# Patient Record
Sex: Female | Born: 2000 | Race: Black or African American | Hispanic: No | Smoking: Never smoker
Health system: Southern US, Community
[De-identification: ages and names within clinical notes are randomized; demographics above are authoritative.]

## PROBLEM LIST (undated history)

## (undated) DIAGNOSIS — A749 Chlamydial infection, unspecified: Secondary | ICD-10-CM

## (undated) DIAGNOSIS — Z1159 Encounter for screening for other viral diseases: Secondary | ICD-10-CM

## (undated) DIAGNOSIS — N760 Acute vaginitis: Secondary | ICD-10-CM

## (undated) DIAGNOSIS — B9689 Other specified bacterial agents as the cause of diseases classified elsewhere: Secondary | ICD-10-CM

## (undated) DIAGNOSIS — B009 Herpesviral infection, unspecified: Secondary | ICD-10-CM

## (undated) HISTORY — DX: Herpesviral infection, unspecified: Z11.59

## (undated) HISTORY — DX: Other specified bacterial agents as the cause of diseases classified elsewhere: B96.89

## (undated) HISTORY — DX: Herpesviral infection, unspecified: B00.9

## (undated) HISTORY — PX: OTHER SURGICAL HISTORY: SHX169

## (undated) HISTORY — DX: Acute vaginitis: N76.0

## (undated) HISTORY — DX: Chlamydial infection, unspecified: A74.9

---
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
## 2021-01-27 DIAGNOSIS — Z419 Encounter for procedure for purposes other than remedying health state, unspecified: Secondary | ICD-10-CM | POA: Diagnosis not present

## 2021-02-05 ENCOUNTER — Ambulatory Visit (INDEPENDENT_AMBULATORY_CARE_PROVIDER_SITE_OTHER): Payer: Medicaid Other | Admitting: Certified Nurse Midwife

## 2021-02-05 ENCOUNTER — Other Ambulatory Visit (HOSPITAL_COMMUNITY)
Admission: RE | Admit: 2021-02-05 | Discharge: 2021-02-05 | Disposition: A | Discharge status: Home / Self Care | Payer: Medicaid Other | Source: Ambulatory Visit | Attending: Certified Nurse Midwife | Admitting: Certified Nurse Midwife

## 2021-02-05 ENCOUNTER — Other Ambulatory Visit: Payer: Self-pay

## 2021-02-05 ENCOUNTER — Encounter: Payer: Self-pay | Admitting: Certified Nurse Midwife

## 2021-02-05 VITALS — BP 102/69 | HR 73 | Ht 64.0 in | Wt 106.3 lb

## 2021-02-05 DIAGNOSIS — Z23 Encounter for immunization: Secondary | ICD-10-CM

## 2021-02-05 DIAGNOSIS — R399 Unspecified symptoms and signs involving the genitourinary system: Secondary | ICD-10-CM

## 2021-02-05 DIAGNOSIS — Z113 Encounter for screening for infections with a predominantly sexual mode of transmission: Secondary | ICD-10-CM | POA: Diagnosis not present

## 2021-02-05 DIAGNOSIS — R109 Unspecified abdominal pain: Secondary | ICD-10-CM | POA: Diagnosis not present

## 2021-02-05 DIAGNOSIS — Z01419 Encounter for gynecological examination (general) (routine) without abnormal findings: Secondary | ICD-10-CM | POA: Diagnosis not present

## 2021-02-05 LAB — POCT URINALYSIS DIPSTICK
Bilirubin, UA: NEGATIVE
Blood, UA: NEGATIVE
Glucose, UA: NEGATIVE
Ketones, UA: NEGATIVE
Leukocytes, UA: NEGATIVE
Nitrite, UA: NEGATIVE
Protein, UA: NEGATIVE
Spec Grav, UA: 1.025 (ref 1.010–1.025)
Urobilinogen, UA: 0.2 E.U./dL
pH, UA: 6.5 (ref 5.0–8.0)

## 2021-02-05 MED ORDER — TETANUS-DIPHTH-ACELL PERTUSSIS 5-2.5-18.5 LF-MCG/0.5 IM SUSY
0.5000 mL | PREFILLED_SYRINGE | Freq: Once | INTRAMUSCULAR | Status: AC
  Administered 2021-02-05: 0.5 mL via INTRAMUSCULAR

## 2021-02-05 NOTE — Progress Notes (Signed)
GYNECOLOGY ANNUAL PREVENTATIVE CARE ENCOUNTER NOTE  History:     Kaitlin Ford is a 21 y.o. G0P0000 female here for a routine annual gynecologic exam.  Current complaints: abdominal pain , feels like someone punched her in her stomach, and nausea for the past few weeks.   Denies abnormal vaginal bleeding, discharge, pelvic pain, problems with intercourse or other gynecologic concerns.     Social Relationship: boyfriend Living: apartment alone /sometimes stays with boyfriend Work: in Multimedia programmer  Exercise: 10 min 3 x wk Smoke/Alcohol/drug use: alcohol use -2 x week, MJ use  2x wk, Vaping 3-5 times a day.  Gynecologic History No LMP recorded (lmp unknown). Contraception: condoms, Nexplanon ( expired ) Last Pap: n/a .  Last mammogram: n/a.    Upstream - 02/05/21 1101       Pregnancy Intention Screening   Does the patient want to become pregnant in the next year? No    Does the patient's partner want to become pregnant in the next year? Yes    Would the patient like to discuss contraceptive options today? Yes      Contraception Wrap Up   Current Method No Method - Other Reason            The pregnancy intention screening data noted above was reviewed. Potential methods of contraception were discussed. The patient elected to proceed with condoms.   Obstetric History OB History  Gravida Para Term Preterm AB Living  0 0 0 0 0 0  SAB IAB Ectopic Multiple Live Births  0 0 0 0 0    History reviewed. No pertinent past medical history.  History reviewed. No pertinent surgical history.  Current Outpatient Medications on File Prior to Visit  Medication Sig Dispense Refill   ondansetron (ZOFRAN) 4 MG tablet Take 1 tablet (4 mg total) by mouth every 6 (six) hours. (Patient not taking: Reported on 02/05/2021) 12 tablet 0   No current facility-administered medications on file prior to visit.    No Known Allergies  Social History:  reports that she has  never smoked. She has never used smokeless tobacco. She reports current alcohol use. She reports that she does not use drugs.  Family History  Problem Relation Age of Onset   Breast cancer Mother    Seizures Mother    Cancer Maternal Grandmother     The following portions of the patient's history were reviewed and updated as appropriate: allergies, current medications, past family history, past medical history, past social history, past surgical history and problem list.  Review of Systems Pertinent items noted in HPI and remainder of comprehensive ROS otherwise negative.  Physical Exam:  BP 102/69    Pulse 73    Ht 5\' 4"  (1.626 m)    Wt 106 lb 4.8 oz (48.2 kg)    LMP  (LMP Unknown)    BMI 18.25 kg/m  CONSTITUTIONAL: Well-developed, well-nourished female in no acute distress.  HENT:  Normocephalic, atraumatic, External right and left ear normal. Oropharynx is clear and moist EYES: Conjunctivae and EOM are normal. Pupils are equal, round, and reactive to light. No scleral icterus.  NECK: Normal range of motion, supple, no masses.  Normal thyroid.  SKIN: Skin is warm and dry. No rash noted. Not diaphoretic. No erythema. No pallor. MUSCULOSKELETAL: Normal range of motion. No tenderness.  No cyanosis, clubbing, or edema.  2+ distal pulses. NEUROLOGIC: Alert and oriented to person, place, and time. Normal reflexes, muscle tone coordination.  PSYCHIATRIC:  Normal mood and affect. Normal behavior. Normal judgment and thought content. CARDIOVASCULAR: Normal heart rate noted, regular rhythm RESPIRATORY: Clear to auscultation bilaterally. Effort and breath sounds normal, no problems with respiration noted. BREASTS: Symmetric in size. No masses, tenderness, skin changes, nipple drainage, or lymphadenopathy bilaterally.  ABDOMEN: Soft, no distention noted.  No tenderness, rebound or guarding.  PELVIC: Normal appearing external genitalia and urethral meatus; normal appearing vaginal mucosa and cervix.   No abnormal discharge noted.  Pap smear not due.Swab for STD screening.  Normal uterine size, no other palpable masses, no uterine or adnexal tenderness.  .   Assessment and Plan:    1. Well woman exam with routine gynecological exam  Pap: not indicated Mammogram : n/a  Labs: STD testing, vaginal swab Refills: Tdap today, Flu today Referral:GI  Routine preventative health maintenance measures emphasized. Please refer to After Visit Summary for other counseling recommendations.    Info given on HPV vaccine.   Doreene Burke, CNM [facility],  [facility]

## 2021-02-05 NOTE — Patient Instructions (Signed)
Preventing Cervical Cancer Cervical cancer is cancer that grows on the cervix. The cervix is at the bottom of the uterus. It connects the uterus to the vagina. The uterus is where a baby develops during pregnancy. Cancer occurs when cells become abnormal and start to grow out of control. If cervical cancer is not found early, it can spread and become dangerous. Cervical cancer cannot always be prevented, but you can take steps to lower your risk of developing this condition. How can this condition affect me? Cervical cancer grows slowly and may not cause any symptoms at first. Over time, the cancer can grow deep into the cervix tissue and spread to other areas. This may take years, and it may happen without you knowing about it. If it is found early, cervical cancer can be treated effectively. If the cancer has grown deep into your cervix or has spread, it will be more difficult to treat. Most cases of cervical cancer are caused by an STI (sexually transmitted infection) called human papillomavirus (HPV). One way to reduce your risk of cervical cancer is to take steps to avoid infection with the HPV virus. Getting regular Pap tests is also important because this can help identify changes in cells that could lead to cancer. Your chances of getting this disease can also be reduced by making certain lifestyle changes. What can increase my risk? You are more likely to develop this condition if: You have certain things in your sexual history, such as: Having a sexually transmitted viral infection. These include chlamydia and herpes. Having more than one sexual partner, or having sex with someone who has more than one sexual partner. Not using condoms during sex. Having been sexually active before the age of 15. Your mother took a medicine called diethylstilbestrol (DES) while pregnant with you, causing you to be exposed to this medicine before birth. Your mother or sister has had cervical cancer. You are  between the ages of 31-50. You have or have had certain other medical conditions, such as: Previous cancer of the vagina or vulva. A weakened body defense system (immune system). A history of dysplasia of the cervix. You use oral contraceptives, also called birth control pills. You smoke or breathe in secondhand smoke. What actions can I take to prevent cervical cancer? Preventing HPV infection  Ask your health care provider about getting the HPV vaccine. If you are 34 years old or younger, you may need to get this vaccine, which is given in three doses over 6 months. This vaccine protects against the types of HPV that could cause cancer. Limit the number of people you have sex with. Also avoid having sex with people who have had many sex partners. Use a latex condom every time you have sex. Getting Pap tests Get Pap tests regularly, starting at age 69. Talk with your health care provider about how often you need these tests. Having regular Pap tests will help identify changes in cells that could lead to cancer. Steps can then be taken to prevent cancer from developing. Most women who are 37?20 years of age should have a Pap test every 3 years. Most women who are 42?20 years of age should have a Pap test in combination with an HPV test every 5 years. Women with a higher risk of cervical cancer, such as those with a weakened immune system or those who were exposed to DES medicine before birth, may need more frequent testing. Making other lifestyle changes  Do not use any  products that contain nicotine or tobacco, such as cigarettes, e-cigarettes, and chewing tobacco. If you need help quitting, ask your health care provider. Eat a healthy diet that includes at least 5 servings of fruits and vegetables every day. Lose weight if you are overweight. Where to find support Talk with your health care provider, school nurse, or local health department for guidance about screening and  vaccination. Some children and teens may be able to get the HPV vaccine free of charge through the U.S. government's Vaccines for Children [facility]) program. Other places that provide vaccinations include: Public health clinics. Check with your local health department. [place], where you would pay only what you can afford. To find one near you, check this website: http://lyons.com/ [place]. These are part of a program for Medicare and Medicaid patients who live in rural areas. The National Breast and Cervical Cancer Early Detection Program also provides breast and cervical cancer screenings and diagnostic services to low-income, uninsured, and underinsured women. Cervical cancer can be passed down through families. Talk with your health care provider or a genetic counselor to learn more about genetic testing for cancer. Where to find more information Learn more about cervical cancer from: [employer] of Gynecology: www.acog.org American Cancer Society: www.cancer.org Centers for Disease Control and Prevention: http://www.wolf.info/ Contact a health care provider if you have: Pelvic pain. Unusual discharge or bleeding from your vagina. Summary Cervical cancer is cancer that grows on the cervix. The cervix is at the bottom of the uterus. Ask your health care provider about getting the HPV vaccine. Be sure to get regular Pap tests as recommended by your health care provider. See your health care provider right away if you have any pelvic pain or unusual discharge or bleeding from your vagina. This information is not intended to replace advice given to you by your health care provider. Make sure you discuss any questions you have with your health care provider. Document Revised: 08/16/2018 Document Reviewed: 08/16/2018 Elsevier Patient Education  [place] 50-10 Years Old, Female Preventive care refers to lifestyle choices and  visits with your health care provider that can promote health and wellness. Preventive care visits are also called wellness exams. What can I expect for my preventive care visit? Counseling During your preventive care visit, your health care provider may ask about your: Medical history, including: Past medical problems. Family medical history. Pregnancy history. Current health, including: Menstrual cycle. Method of birth control. Emotional well-being. Home life and relationship well-being. Sexual activity and sexual health. Lifestyle, including: Alcohol, nicotine or tobacco, and drug use. Access to firearms. Diet, exercise, and sleep habits. Work and work Statistician. Sunscreen use. Safety issues such as seatbelt and bike helmet use. Physical exam Your health care provider may check your: Height and weight. These may be used to calculate your BMI (body mass index). BMI is a measurement that tells if you are at a healthy weight. Waist circumference. This measures the distance around your waistline. This measurement also tells if you are at a healthy weight and may help predict your risk of certain diseases, such as type 2 diabetes and high blood pressure. Heart rate and blood pressure. Body temperature. Skin for abnormal spots. What immunizations do I need? Vaccines are usually given at various ages, according to a schedule. Your health care provider will recommend vaccines for you based on your age, medical history, and lifestyle or other factors, such as travel or where you work. What tests  do I need? Screening Your health care provider may recommend screening tests for certain conditions. This may include: Pelvic exam and Pap test. Lipid and cholesterol levels. Diabetes screening. This is done by checking your blood sugar (glucose) after you have not eaten for a while (fasting). Hepatitis B test. Hepatitis C test. HIV (human immunodeficiency virus) test. STI (sexually  transmitted infection) testing, if you are at risk. BRCA-related cancer screening. This may be done if you have a family history of breast, ovarian, tubal, or peritoneal cancers. Talk with your health care provider about your test results, treatment options, and if necessary, the need for more tests. Follow these instructions at home: Eating and drinking  Eat a healthy diet that includes fresh fruits and vegetables, whole grains, lean protein, and low-fat dairy products. Take vitamin and mineral supplements as recommended by your health care provider. Do not drink alcohol if: Your health care provider tells you not to drink. You are pregnant, may be pregnant, or are planning to become pregnant. If you drink alcohol: Limit how much you have to 0-1 drink a day. Know how much alcohol is in your drink. In the [place] one drink equals one 12 oz bottle of beer (355 mL), one 5 oz glass of wine (148 mL), or one 1 oz glass of hard liquor (44 mL). Lifestyle Brush your teeth every morning and night with fluoride toothpaste. Floss one time each day. Exercise for at least 30 minutes 5 or more days each week. Do not use any products that contain nicotine or tobacco. These products include cigarettes, chewing tobacco, and vaping devices, such as e-cigarettes. If you need help quitting, ask your health care provider. Do not use drugs. If you are sexually active, practice safe sex. Use a condom or other form of protection to prevent STIs. If you do not wish to become pregnant, use a form of birth control. If you plan to become pregnant, see your health care provider for a prepregnancy visit. Find healthy ways to manage stress, such as: Meditation, yoga, or listening to music. Journaling. Talking to a trusted person. Spending time with friends and family. Minimize exposure to UV radiation to reduce your risk of skin cancer. Safety Always wear your seat belt while driving or riding in a vehicle. Do not  drive: If you have been drinking alcohol. Do not ride with someone who has been drinking. If you have been using any mind-altering substances or drugs. While texting. When you are tired or distracted. Wear a helmet and other protective equipment during sports activities. If you have firearms in your house, make sure you follow all gun safety procedures. Seek help if you have been physically or sexually abused. What's next? Go to your health care provider once a year for an annual wellness visit. Ask your health care provider how often you should have your eyes and teeth checked. Stay up to date on all vaccines. This information is not intended to replace advice given to you by your health care provider. Make sure you discuss any questions you have with your health care provider. Document Revised: 07/11/2020 Document Reviewed: 07/11/2020 Elsevier Patient Education  [place].

## 2021-02-07 ENCOUNTER — Telehealth: Payer: Self-pay

## 2021-02-07 DIAGNOSIS — A599 Trichomoniasis, unspecified: Secondary | ICD-10-CM

## 2021-02-07 LAB — CERVICOVAGINAL ANCILLARY ONLY
Bacterial Vaginitis (gardnerella): NEGATIVE
Candida Glabrata: NEGATIVE
Candida Vaginitis: NEGATIVE
Chlamydia: NEGATIVE
Comment: NEGATIVE
Comment: NEGATIVE
Comment: NEGATIVE
Comment: NEGATIVE
Comment: NEGATIVE
Comment: NORMAL
Neisseria Gonorrhea: NEGATIVE
Trichomonas: POSITIVE — AB

## 2021-02-07 LAB — URINE CULTURE: Organism ID, Bacteria: NO GROWTH

## 2021-02-07 MED ORDER — METRONIDAZOLE 500 MG PO TABS
500.0000 mg | ORAL_TABLET | Freq: Two times a day (BID) | ORAL | 0 refills | Status: DC
Start: 2021-02-07 — End: 2021-04-08

## 2021-02-07 MED ORDER — METRONIDAZOLE 500 MG PO TABS: 500.0000 mg | ORAL_TABLET | Freq: Once | ORAL | 0 refills | Status: AC

## 2021-02-07 NOTE — Telephone Encounter (Signed)
Spoke with patient in regards to positive lab results. Explained to pt what she tested positive for; relayed to her to take prescribed abx for treatment and that partner must be tested/treated as well. Okayed by MS to send in treatment for partner. Advised pt to abstain from intercourse until her and partner are fully treated and to use condoms in future. Pt agrees to plan and agrees to f/u for TOC in 4 weeks. All questions answered.

## 2021-02-07 NOTE — Telephone Encounter (Signed)
Scheduled for 04/08/2021 at 215

## 2021-02-08 LAB — HEPATITIS C ANTIBODY: Hep C Virus Ab: <0.1 s/co ratio (ref 0.0–0.9)

## 2021-02-08 LAB — HSV(HERPES SIMPLEX VRS) I + II AB-IGG
HSV 1 Glycoprotein G Ab, IgG: 43.3 index — ABNORMAL HIGH (ref 0.00–0.90)
HSV 2 IgG, Type Spec: 4.93 index — ABNORMAL HIGH (ref 0.00–0.90)

## 2021-02-08 LAB — HSV-2 IGG SUPPLEMENTAL TEST: HSV-2 IgG Supplemental Test: POSITIVE — AB

## 2021-02-08 LAB — HIV ANTIBODY (ROUTINE TESTING W REFLEX): HIV Screen 4th Generation wRfx: NONREACTIVE

## 2021-02-08 LAB — HEPATITIS B SURFACE ANTIGEN: Hepatitis B Surface Ag: NEGATIVE

## 2021-02-08 LAB — RPR: RPR Ser Ql: NONREACTIVE

## 2021-02-10 ENCOUNTER — Other Ambulatory Visit: Payer: Self-pay | Admitting: Certified Nurse Midwife

## 2021-02-12 ENCOUNTER — Ambulatory Visit (INDEPENDENT_AMBULATORY_CARE_PROVIDER_SITE_OTHER): Payer: Medicaid Other | Admitting: Certified Nurse Midwife

## 2021-02-12 ENCOUNTER — Encounter: Payer: Self-pay | Admitting: Certified Nurse Midwife

## 2021-02-12 ENCOUNTER — Other Ambulatory Visit: Payer: Self-pay

## 2021-02-12 VITALS — BP 102/67 | HR 82 | Ht 64.0 in | Wt 105.4 lb

## 2021-02-12 DIAGNOSIS — Z3046 Encounter for surveillance of implantable subdermal contraceptive: Secondary | ICD-10-CM

## 2021-02-12 NOTE — Progress Notes (Deleted)
Established Patient Office Visit  Subjective:  Patient ID: Kaitlin Ford, female    DOB: 2000-09-20  Age: 21 y.o. 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  Family History  Problem Relation Age of Onset   Breast cancer Mother    Seizures Mother    Cancer Maternal Grandmother     Social History   Socioeconomic History   Marital status: Single    Spouse name: Not on file   Number of children: Not on file   Years of education: Not on file   Highest education level: Not on file  Occupational History   Not on file  Tobacco Use   Smoking status: Never   Smokeless tobacco: Never  Substance and Sexual Activity   Alcohol use: Yes   Drug use: No   Sexual activity: Yes    Birth control/protection: None  Other Topics Concern   Not on file  Social History Narrative   Not on file   Social Determinants of Health   Financial Resource Strain: Not on file  Food Insecurity: Not on file  Transportation Needs: Not on file  Physical Activity: Not on file  Stress: Not on file  Social Connections: Not on file  Intimate Partner Violence: Not on file    Outpatient Medications Prior to Visit  Medication Sig Dispense Refill   metroNIDAZOLE (FLAGYL) 500 MG tablet Take 1 tablet (500 mg total) by mouth 2 (two) times daily. 14 tablet 0   ondansetron (ZOFRAN) 4 MG tablet Take 1 tablet (4 mg total) by mouth every 6 (six) hours. (Patient not taking: Reported on 02/05/2021) 12 tablet 0   No facility-administered medications prior to visit.    No Known Allergies  ROS Review of Systems    Objective:    Physical Exam  BP 102/67    Pulse 82    Ht 5\' 4"  (1.626 m)    Wt 105 lb 6.4 oz (47.8 kg)    LMP 02/11/2021 (Exact Date)     BMI 18.09 kg/m  Wt Readings from Last 3 Encounters:  02/12/21 105 lb 6.4 oz (47.8 kg) (9 %, Z= -1.34)*  02/05/21 106 lb 4.8 oz (48.2 kg) (10 %, Z= -1.27)*  07/24/20 106 lb 9.6 oz (48.4 kg) (12 %, Z= -1.19)*   * Growth percentiles are based on CDC (Girls, 2-20 Years) data.     Health Maintenance Due  Topic Date Due   HPV VACCINES (1 - 2-dose series) Never done       Topic Date Due   HPV VACCINES (1 - 2-dose series) Never done    No results found for: TSH Lab Results  Component Value Date   WBC 7.2 09/10/2018   HGB 11.7 (L) 09/10/2018   HCT 35.8 (L) 09/10/2018   MCV 91.8 09/10/2018   PLT 303 09/10/2018   Lab Results  Component Value Date   NA 139 09/10/2018   K 3.5 09/10/2018   CO2 25 09/10/2018   GLUCOSE 93 09/10/2018   BUN 9 09/10/2018   CREATININE 0.61 09/10/2018   BILITOT 0.5 09/10/2018   ALKPHOS 63 09/10/2018   AST 13 (L) 09/10/2018   ALT 8 09/10/2018  PROT 7.9 09/10/2018   ALBUMIN 4.1 09/10/2018   CALCIUM 9.4 09/10/2018   ANIONGAP 9 09/10/2018   No results found for: CHOL No results found for: HDL No results found for: LDLCALC No results found for: TRIG No results found for: CHOLHDL No results found for: HGDJ2E    Assessment & Plan:   Problem List Items Addressed This Visit   None   No orders of the defined types were placed in this encounter.   Follow-up: Return in about 3 weeks (around 03/05/2021) for toc.    Doreene Burke, CNM

## 2021-02-12 NOTE — Progress Notes (Signed)
Kaitlin Ford is a 21 y.o. year old [place] female here for Nexplanon removal .  She was given informed consent for removal of her Nexplanon. Her Nexplanon was placed 3 yrs ago, Patient's last menstrual period was 02/11/2021 (exact date).,    BP 102/67    Pulse 82    Ht 5\' 4"  (1.626 m)    Wt 105 lb 6.4 oz (47.8 kg)    LMP 02/11/2021 (Exact Date)    BMI 18.09 kg/m  Patient's last menstrual period was 02/11/2021 (exact date). No results found for this or any previous visit (from the past 24 hour(s)).   Appropriate time out taken. Nexplanon site identified.  Area prepped in usual sterile fashon. Two cc's of 2% lidocaine was used to anesthetize the area. A small stab incision was made right beside the implant on the distal portion.  The Nexplanon rod was grasped using hemostats and removed intact without difficulty.    There was less than 3 cc blood loss. There were no complications.  The patient tolerated the procedure well.  She was instructed to keep the area clean and dry, remove pressure bandage in 24 hours, and keep insertion site covered with the steri-strips for 3-5 days.  She will use condoms for birth control.   Follow-up PRN problems.  Philip Aspen, CNM

## 2021-02-12 NOTE — Patient Instructions (Signed)
Nexplanon Instructions After care ° °Keep bandage clean and dry for 24 hours ° °May use ice/Tylenol/Ibuprofen for soreness or pain ° °If you develop fever, drainage or increased warmth from incision site-contact office immediately ° ° °

## 2021-02-26 ENCOUNTER — Ambulatory Visit (INDEPENDENT_AMBULATORY_CARE_PROVIDER_SITE_OTHER): Payer: Medicaid Other | Admitting: Certified Nurse Midwife

## 2021-02-26 ENCOUNTER — Other Ambulatory Visit: Payer: Self-pay

## 2021-02-26 ENCOUNTER — Encounter: Payer: Self-pay | Admitting: Certified Nurse Midwife

## 2021-02-26 ENCOUNTER — Other Ambulatory Visit (HOSPITAL_COMMUNITY)
Admission: RE | Admit: 2021-02-26 | Discharge: 2021-02-26 | Disposition: A | Discharge status: Home / Self Care | Payer: Medicaid Other | Source: Ambulatory Visit | Attending: Certified Nurse Midwife | Admitting: Certified Nurse Midwife

## 2021-02-26 VITALS — BP 107/70 | HR 84 | Ht 64.0 in | Wt 102.4 lb

## 2021-02-26 DIAGNOSIS — Z113 Encounter for screening for infections with a predominantly sexual mode of transmission: Secondary | ICD-10-CM

## 2021-02-26 NOTE — Progress Notes (Signed)
GYN ENCOUNTER NOTE  Subjective:       Kaitlin Ford is a 21 y.o. G0P0000 female is here for gynecologic evaluation of the following issues:  1. Follow for test of cure. Pt has positive swab for trichomoniasis 1/10.  States she did not sustain from intercourse as directed and is concerned that the infection may not have cleared.    Gynecologic History Patient's last menstrual period was 02/11/2021 (exact date). Contraception: condoms Last Pap: n/a.  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 Trichomoniasis   No past surgical history on file.  Current Outpatient Medications on File Prior to Visit  Medication Sig Dispense Refill   metroNIDAZOLE (FLAGYL) 500 MG tablet Take 1 tablet (500 mg total) by mouth 2 (two) times daily. 14 tablet 0   No current facility-administered medications on file prior to visit.    No Known Allergies  Social History   Socioeconomic History   Marital status: Single    Spouse name: Not on file   Number of children: Not on file   Years of education: Not on file   Highest education level: Not on file  Occupational History   Not on file  Tobacco Use   Smoking status: Never   Smokeless tobacco: Never  Substance and Sexual Activity   Alcohol use: Yes   Drug use: No   Sexual activity: Yes    Birth control/protection: None  Other Topics Concern   Not on file  Social History Narrative   Not on file   Social Determinants of Health   Financial Resource Strain: Not on file  Food Insecurity: Not on file  Transportation Needs: Not on file  Physical Activity: Not on file  Stress: Not on file  Social Connections: Not on file  Intimate Partner Violence: Not on file    Family History  Problem  Relation Age of Onset   Breast cancer Mother    Seizures Mother    Cancer Maternal Grandmother     The following portions of the patient's history were reviewed and updated as appropriate: allergies, current medications, past family history, past medical history, past social history, past surgical history and problem list.  Review of Systems Review of Systems - Negative except as mentioned in HPI Review of Systems - General ROS: negative for - chills, fatigue, fever, hot flashes, malaise or night sweats Hematological and Lymphatic ROS: negative for - bleeding problems or swollen lymph nodes Gastrointestinal ROS: negative for - abdominal pain, blood in stools, change in bowel habits and nausea/vomiting Musculoskeletal ROS: negative for - joint pain, muscle pain or muscular weakness Genito-Urinary ROS: negative for - change in menstrual cycle, dysmenorrhea, dyspareunia, dysuria, genital discharge, genital ulcers, hematuria, incontinence, irregular/heavy menses, nocturia or pelvic pain.  Objective:   LMP 02/11/2021 (Exact Date)  CONSTITUTIONAL: Well-developed, well-nourished female in no acute distress.  HENT:  Normocephalic, atraumatic.  NECK: Normal range of motion, supple, no masses.  Normal thyroid.  SKIN: Skin is warm and dry. No rash noted. Not diaphoretic. No erythema. No pallor. NEUROLGIC: Alert and oriented to person, place, and time. PSYCHIATRIC: Normal mood and affect. Normal behavior.  Normal judgment and thought content. CARDIOVASCULAR:Not Examined RESPIRATORY: Not Examined BREASTS: Not Examined ABDOMEN: Soft, non distended; Non tender.  No Organomegaly. PELVIC:  External Genitalia: Normal  BUS: Normal  Vagina: Normal  Cervix: Normal, white discharge no odor. MUSCULOSKELETAL: Normal range of motion. No tenderness.  No cyanosis, clubbing, or edema.     Assessment:   1. Screening for STD (sexually transmitted disease)   Plan:   Swab collected. Will follow up with  results. Encourage safe sex practices.   Philip Aspen, CNM

## 2021-02-27 DIAGNOSIS — Z419 Encounter for procedure for purposes other than remedying health state, unspecified: Secondary | ICD-10-CM | POA: Diagnosis not present

## 2021-02-28 ENCOUNTER — Other Ambulatory Visit: Payer: Self-pay | Admitting: Certified Nurse Midwife

## 2021-02-28 ENCOUNTER — Encounter: Payer: Self-pay | Admitting: Certified Nurse Midwife

## 2021-02-28 LAB — CERVICOVAGINAL ANCILLARY ONLY
Bacterial Vaginitis (gardnerella): NEGATIVE
Candida Glabrata: NEGATIVE
Candida Vaginitis: POSITIVE — AB
Chlamydia: NEGATIVE
Comment: NEGATIVE
Comment: NEGATIVE
Comment: NEGATIVE
Comment: NEGATIVE
Comment: NEGATIVE
Comment: NORMAL
Neisseria Gonorrhea: NEGATIVE
Trichomonas: NEGATIVE

## 2021-02-28 MED ORDER — FLUCONAZOLE 150 MG PO TABS: 150.0000 mg | ORAL_TABLET | Freq: Once | ORAL | 0 refills | Status: AC

## 2021-03-01 ENCOUNTER — Telehealth: Payer: Self-pay | Admitting: Certified Nurse Midwife

## 2021-03-01 ENCOUNTER — Other Ambulatory Visit: Payer: Self-pay

## 2021-03-01 DIAGNOSIS — B9689 Other specified bacterial agents as the cause of diseases classified elsewhere: Secondary | ICD-10-CM

## 2021-03-01 MED ORDER — FLUCONAZOLE 150 MG PO TABS: 150.0000 mg | ORAL_TABLET | Freq: Once | ORAL | 0 refills | Status: AC

## 2021-03-01 NOTE — Telephone Encounter (Signed)
Medication has been sent to pharmacy on file.  

## 2021-03-01 NOTE — Telephone Encounter (Signed)
Pt called stating that she was suppose to have RX sent to her pharmacy today and that there is nothing there- confirmed pharmacy on file.

## 2021-03-27 DIAGNOSIS — Z419 Encounter for procedure for purposes other than remedying health state, unspecified: Secondary | ICD-10-CM | POA: Diagnosis not present

## 2021-03-29 ENCOUNTER — Encounter: Payer: Self-pay | Admitting: Gastroenterology

## 2021-04-08 ENCOUNTER — Encounter: Payer: Self-pay | Admitting: Gastroenterology

## 2021-04-08 ENCOUNTER — Other Ambulatory Visit: Payer: Self-pay

## 2021-04-08 ENCOUNTER — Ambulatory Visit (INDEPENDENT_AMBULATORY_CARE_PROVIDER_SITE_OTHER): Payer: Medicaid Other | Admitting: Gastroenterology

## 2021-04-08 VITALS — BP 107/61 | HR 106 | Temp 98.7°F | Ht 64.0 in | Wt 103.5 lb

## 2021-04-08 DIAGNOSIS — R1010 Upper abdominal pain, unspecified: Secondary | ICD-10-CM | POA: Diagnosis not present

## 2021-04-08 MED ORDER — DICYCLOMINE HCL 10 MG PO CAPS: 10.0000 mg | ORAL_CAPSULE | Freq: Three times a day (TID) | ORAL | 0 refills | Status: DC

## 2021-04-08 MED ORDER — AMITRIPTYLINE HCL 10 MG PO TABS: 10.0000 mg | ORAL_TABLET | Freq: Every day | ORAL | 0 refills | Status: DC

## 2021-04-08 NOTE — Patient Instructions (Addendum)
Please come back for a lab test. Please do not eat or candy for a 1 hour before coming for labs. our lab is open on Mondays 1pm to 4pm, Tuesdays 8:30am to 4pm. Wednesdays 8:30am to 4pm and thursdays 1pm to 4pm.  ?

## 2021-04-08 NOTE — Progress Notes (Signed)
Arlyss Repress, MD [address]  [address]  [place], [place] [address]  Main: [phone]  Fax: [phone]    Gastroenterology Consultation  Referring Provider:     Vivi Martens, FNP Primary Care Physician:  Vivi Martens, FNP Primary Gastroenterologist:  Dr. Arlyss Repress Reason for Consultation:     Upper abdominal pain        HPI:   Kaitlin Ford is a 21 y.o. female referred by Dr. Vivi Martens, FNP  for consultation & management of upper abdominal pain.  Patient reports that for almost a year, she has been experiencing intermittent episodes of upper abdominal pain associated with nausea and irregular bowel habits.  She smokes marijuana.  Her appetite is low and GI symptoms are limiting her food intake.  Patient is accompanied by her boyfriend today.  Patient has not tried any medication so far.  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 No pertinent surgical history.   Current Outpatient Medications:    amitriptyline (ELAVIL) 10 MG tablet, Take 1 tablet (10 mg total) by mouth at bedtime., Disp: 30 tablet, Rfl: 0   dicyclomine (BENTYL) 10 MG capsule, Take 1 capsule (10 mg total) by mouth 3 (three) times daily before meals., Disp: 30 capsule, Rfl: 0   Family History  Problem Relation Age of Onset   Breast cancer Mother    Seizures Mother    Cancer Maternal Grandmother      Social History   Tobacco Use   Smoking status: Never   Smokeless tobacco: Never  Substance Use Topics   Alcohol use: Yes   Drug use: No    Allergies as of 04/08/2021   (No Known Allergies)    Review of Systems:    All systems reviewed and negative except where  noted in HPI.   Physical Exam:  BP 107/61 (BP Location: Left Arm, Patient Position: Sitting, Cuff Size: Normal)   Pulse (!) 106   Temp 98.7 F (37.1 C) (Oral)   Ht 5\' 4"  (1.626 m)   Wt 103 lb 8 oz (46.9 kg)   BMI 17.77 kg/m  No LMP recorded.  General:   Alert, thin built, moderately nourished, pleasant and cooperative in NAD Head:  Normocephalic and atraumatic. Eyes:  Sclera clear, no icterus.   Conjunctiva pink. Ears:  Normal auditory acuity. Nose:  No deformity, discharge, or lesions. Mouth:  No deformity or lesions,oropharynx pink & moist. Neck:  Supple; no masses or thyromegaly. Lungs:  Respirations even and unlabored.  Clear throughout to auscultation.   No wheezes, crackles, or rhonchi. No acute distress. Heart:  Regular rate and rhythm; no murmurs, clicks, rubs, or gallops. Abdomen:  Normal bowel sounds. Soft, non-tender and non-distended without masses, hepatosplenomegaly or hernias noted.  No guarding or rebound tenderness.   Rectal: Not performed Msk:  Symmetrical without gross deformities. Good, equal movement & strength bilaterally. Pulses:  Normal pulses noted. Extremities:  No clubbing or edema.  No cyanosis. Neurologic:  Alert and oriented x3;  grossly normal neurologically. Skin:  Intact without significant lesions or rashes. No jaundice. Psych:  Alert and cooperative. Normal mood and affect.  Imaging  Studies: CT abdomen pelvis with contrast from 08/2018 revealed cholelithiasis without evidence of cholecystitis  Assessment and Plan:   [name] is a 21 y.o. female with BMI 17.7 is seen in consultation for chronic symptoms of upper abdominal pain, associated with nausea, irregular bowel habits  Recommend H. pylori breath test Trial of dicyclomine 10mg  TID as needed Trial of low dose amitriptylline 10mg  at bedtime Patient called her mom over phone during the visit and her mom was concerned about unable to gain weight and she was asking if I could  prescribe steroids or some other medication to help gain weight.  I recommended against using steroids due to side effects.  If H. pylori breath test is negative and patient is not responding to above medication, will pursue further work-up including endoscopic evaluation, imaging and patient is agreeable with the plan Also, discussed with her about high-fiber diet, stool softener such as MiraLAX daily to keep her bowels regular, adequate intake of water   Follow up in 3 months   Arlyss Repress, MD

## 2021-04-09 ENCOUNTER — Encounter: Payer: Self-pay | Admitting: Gastroenterology

## 2021-04-16 DIAGNOSIS — R1010 Upper abdominal pain, unspecified: Secondary | ICD-10-CM | POA: Diagnosis not present

## 2021-04-18 LAB — H. PYLORI BREATH TEST: H pylori Breath Test: NEGATIVE

## 2021-04-22 ENCOUNTER — Other Ambulatory Visit: Payer: Self-pay

## 2021-04-22 ENCOUNTER — Encounter: Payer: Self-pay | Admitting: Gastroenterology

## 2021-04-22 DIAGNOSIS — R1013 Epigastric pain: Secondary | ICD-10-CM

## 2021-04-22 MED ORDER — METOCLOPRAMIDE HCL 5 MG PO TABS
5.0000 mg | ORAL_TABLET | Freq: Three times a day (TID) | ORAL | 0 refills | Status: DC
Start: 2021-04-22 — End: 2021-08-13

## 2021-04-22 NOTE — Telephone Encounter (Signed)
Schedule patient for a EGD on 04/25/21 went over instructions ans sent mychart message with instructions. Sent medication to the pharmacy  ?

## 2021-04-25 ENCOUNTER — Other Ambulatory Visit: Payer: Self-pay

## 2021-04-25 ENCOUNTER — Encounter: Payer: Self-pay | Admitting: Gastroenterology

## 2021-04-25 ENCOUNTER — Encounter
Admission: RE | Disposition: A | Discharge status: Home / Self Care | Payer: Self-pay | Source: Home / Self Care | Attending: Gastroenterology

## 2021-04-25 ENCOUNTER — Ambulatory Visit: Payer: Medicaid Other | Admitting: Registered Nurse

## 2021-04-25 ENCOUNTER — Ambulatory Visit
Admission: RE | Admit: 2021-04-25 | Discharge: 2021-04-25 | Disposition: A | Discharge status: Home / Self Care | Payer: Medicaid Other | Attending: Gastroenterology | Admitting: Gastroenterology

## 2021-04-25 DIAGNOSIS — R1013 Epigastric pain: Secondary | ICD-10-CM | POA: Insufficient documentation

## 2021-04-25 DIAGNOSIS — R110 Nausea: Secondary | ICD-10-CM | POA: Diagnosis not present

## 2021-04-25 HISTORY — PX: ESOPHAGOGASTRODUODENOSCOPY (EGD) WITH PROPOFOL: SHX5813

## 2021-04-25 LAB — POCT PREGNANCY, URINE: Preg Test, Ur: NEGATIVE

## 2021-04-25 SURGERY — ESOPHAGOGASTRODUODENOSCOPY (EGD) WITH PROPOFOL
Anesthesia: General

## 2021-04-25 MED ORDER — LIDOCAINE HCL (CARDIAC) PF 100 MG/5ML IV SOSY
PREFILLED_SYRINGE | INTRAVENOUS | Status: DC | PRN
  Administered 2021-04-25: 100 mg via INTRAVENOUS

## 2021-04-25 MED ORDER — PROPOFOL 500 MG/50ML IV EMUL
INTRAVENOUS | Status: AC
  Filled 2021-04-25: qty 100

## 2021-04-25 MED ORDER — PROPOFOL 10 MG/ML IV BOLUS
INTRAVENOUS | Status: DC | PRN
  Administered 2021-04-25: 30 mg via INTRAVENOUS
  Administered 2021-04-25: 70 mg via INTRAVENOUS

## 2021-04-25 MED ORDER — LIDOCAINE HCL (PF) 2 % IJ SOLN
INTRAMUSCULAR | Status: AC
  Filled 2021-04-25: qty 5

## 2021-04-25 MED ORDER — PROPOFOL 500 MG/50ML IV EMUL
INTRAVENOUS | Status: DC | PRN
  Administered 2021-04-25: 200 ug/kg/min via INTRAVENOUS

## 2021-04-25 MED ORDER — KETAMINE HCL 50 MG/ML IJ SOLN
INTRAMUSCULAR | Status: DC | PRN
  Administered 2021-04-25: 25 mg via INTRAMUSCULAR

## 2021-04-25 MED ORDER — DEXMEDETOMIDINE HCL 200 MCG/2ML IV SOLN
INTRAVENOUS | Status: DC | PRN
Start: 2021-04-25 — End: 2021-04-25
  Administered 2021-04-25: 20 ug via INTRAVENOUS

## 2021-04-25 MED ORDER — SODIUM CHLORIDE 0.9 % IV SOLN: INTRAVENOUS | Status: DC

## 2021-04-25 NOTE — Anesthesia Postprocedure Evaluation (Signed)
Anesthesia Post Note ? ?Patient: Maye Hyppolite ? ?Procedure(s) Performed: ESOPHAGOGASTRODUODENOSCOPY (EGD) WITH PROPOFOL ? ?Patient location during evaluation: PACU ?Anesthesia Type: General ?Level of consciousness: awake and oriented ?Pain management: satisfactory to patient ?Vital Signs Assessment: post-procedure vital signs reviewed and stable ?Respiratory status: spontaneous breathing and respiratory function stable ?Cardiovascular status: stable ?Anesthetic complications: no ? ? ?No notable events documented. ? ? ?Last Vitals:  ?Vitals:  ? 04/25/21 0954 04/25/21 1004  ?BP: (!) 95/56 100/62  ?Pulse: 79 71  ?Resp: 16 16  ?Temp:    ?SpO2: 100% 100%  ?  ?Last Pain:  ?Vitals:  ? 04/25/21 1004  ?TempSrc:   ?PainSc: 0-No pain  ? ? ?  ?  ?  ?  ?  ?  ? ?VAN STAVEREN,Katherleen Folkes ? ? ? ? ?

## 2021-04-25 NOTE — Anesthesia Preprocedure Evaluation (Signed)
Anesthesia Evaluation  ?Patient identified by MRN, date of birth, ID band ?Patient awake ? ? ? ?Reviewed: ?Allergy & Precautions, NPO status , Patient's Chart, lab work & pertinent test results ? ?Airway ?Mallampati: I ? ?TM Distance: >3 FB ?Neck ROM: Full ? ? ? Dental ? ?(+) Teeth Intact ?  ?Pulmonary ?neg pulmonary ROS,  ?  ?Pulmonary exam normal ?breath sounds clear to auscultation ? ? ? ? ? ? Cardiovascular ?negative cardio ROS ?Normal cardiovascular exam ?Rhythm:Regular Rate:Normal ? ? ?  ?Neuro/Psych ?negative neurological ROS ? negative psych ROS  ? GI/Hepatic ?negative GI ROS, Neg liver ROS,   ?Endo/Other  ?negative endocrine ROS ? Renal/GU ?negative Renal ROS  ?negative genitourinary ?  ?Musculoskeletal ?negative musculoskeletal ROS ?(+)  ? Abdominal ?Normal abdominal exam  (+)   ?Peds ?negative pediatric ROS ?(+)  Hematology ?negative hematology ROS ?(+)   ?Anesthesia Other Findings ?Past Medical History: ?No date: Bacterial vaginitis ?No date: Chlamydia ?No date: PCR DNA positive for HSV1 ?No date: PCR DNA positive for HSV2 ? ?No past surgical history on file. ? ? ? ? Reproductive/Obstetrics ?negative OB ROS ? ?  ? ? ? ? ? ? ? ? ? ? ? ? ? ?  ?  ? ? ? ? ? ? ? ? ?Anesthesia Physical ?Anesthesia Plan ? ?ASA: 2 ? ?Anesthesia Plan: General  ? ?Post-op Pain Management:   ? ?Induction: Intravenous ? ?PONV Risk Score and Plan: Propofol infusion and TIVA ? ?Airway Management Planned: Natural Airway and Nasal Cannula ? ?Additional Equipment:  ? ?Intra-op Plan:  ? ?Post-operative Plan:  ? ?Informed Consent: I have reviewed the patients History and Physical, chart, labs and discussed the procedure including the risks, benefits and alternatives for the proposed anesthesia with the patient or authorized representative who has indicated his/her understanding and acceptance.  ? ? ? ?Dental Advisory Given ? ?Plan Discussed with: Anesthesiologist, CRNA and Surgeon ? ?Anesthesia Plan  Comments:   ? ? ? ? ? ? ?Anesthesia Quick Evaluation ? ?

## 2021-04-25 NOTE — H&P (Signed)
?Kaitlin Repress, MD ?[address]  ?[address]  ?[place], [place] [address]  ?Main: [phone]  ?Fax: [phone] ?Pager: [phone] ? ?Primary Care Physician:  Vivi Martens, FNP ?Primary Gastroenterologist:  Dr. Arlyss Ford ? ?Pre-Procedure History & Physical: ?HPI:  Kaitlin Ford is a 21 y.o. female is here for an endoscopy. ?  ?Past Medical History:  ?Diagnosis Date  ? Bacterial vaginitis   ? Chlamydia   ? PCR DNA positive for HSV1   ? PCR DNA positive for HSV2   ? ? ?Past Surgical History:  ?Procedure Laterality Date  ? cyst left wrist removal    ? 2022  ? ? ?Prior to Admission medications   ?Medication Sig Start Date End Date Taking? Authorizing Provider  ?amitriptyline (ELAVIL) 10 MG tablet Take 1 tablet (10 mg total) by mouth at bedtime. 04/08/21  Yes Ludell Zacarias, Loel Dubonnet, MD  ?dicyclomine (BENTYL) 10 MG capsule Take 1 capsule (10 mg total) by mouth 3 (three) times daily before meals. 04/08/21   Toney Reil, MD  ?metoCLOPramide (REGLAN) 5 MG tablet Take 1 tablet (5 mg total) by mouth 4 (four) times daily -  before meals and at bedtime for 7 days. 04/22/21 04/29/21  Toney Reil, MD  ? ? ?Allergies as of 04/22/2021  ? (No Known Allergies)  ? ? ?Family History  ?Problem Relation Age of Onset  ? Breast cancer Mother   ? Seizures Mother   ? Cancer Maternal Grandmother   ? ? ?Social History  ? ?Socioeconomic History  ? Marital status: Single  ?  Spouse name: Not on file  ? Number of children: Not on file  ? Years of education: Not on file  ? Highest education level: Not on file  ?Occupational History  ? Not on file  ?Tobacco Use  ? Smoking status: Never  ? Smokeless tobacco: Never  ?Vaping Use  ? Vaping Use: Some days  ?Substance and Sexual Activity  ? Alcohol use: Yes  ?  Comment: occasionally  ? Drug use: Yes  ?  Types: Marijuana  ? Sexual activity: Yes  ?  Birth control/protection: None  ?Other Topics Concern  ? Not on file  ?Social History Narrative  ? Not on file  ? ?Social  Determinants of Health  ? ?Financial Resource Strain: Not on file  ?Food Insecurity: Not on file  ?Transportation Needs: Not on file  ?Physical Activity: Not on file  ?Stress: Not on file  ?Social Connections: Not on file  ?Intimate Partner Violence: Not on file  ? ? ?Review of Systems: ?See HPI, otherwise negative ROS ? ?Physical Exam: ?BP 108/68   Pulse 95   Temp (!) 96.7 ?F (35.9 ?C) (Temporal)   Resp 14   Ht 5\' 4"  (1.626 m)   Wt 47.2 kg   SpO2 100%   BMI 17.85 kg/m?  ?General:   Alert,  pleasant and cooperative in NAD ?Head:  Normocephalic and atraumatic. ?Neck:  Supple; no masses or thyromegaly. ?Lungs:  Clear throughout to auscultation.    ?Heart:  Regular rate and rhythm. ?Abdomen:  Soft, nontender and nondistended. Normal bowel sounds, without guarding, and without rebound.   ?Neurologic:  Alert and  oriented x4;  grossly normal neurologically. ? ?Impression/Plan: ?[name] is here for an endoscopy to be performed for upper abdominal pain, associated with nausea, irregular bowel habits ? ?Risks, benefits, limitations, and alternatives regarding  endoscopy have been reviewed with the patient.  Questions have been answered.  All parties agreeable. ? ? ?  Lannette Donath, MD  04/25/2021, 9:31 AM ?

## 2021-04-25 NOTE — Op Note (Signed)
[facility] ?Gastroenterology ?Patient Name: Kaitlin Ford ?Procedure Date: 04/25/2021 9:38 AM ?MRN: [id number] ?Account #: [id number] ?Date of Birth: 09/05/2000 ?Admit Type: Outpatient ?Age: 21 ?Room: [facility] ENDO ROOM 4 ?Gender: Female ?Note Status: Finalized ?Instrument Name: Upper Endoscope [id number] ?Procedure:             Upper GI endoscopy ?Indications:           Epigastric abdominal pain, Dyspepsia ?Providers:             Toney Reil MD, MD ?Referring MD:          Kandis Mannan. Margo Aye, MD (Referring MD) ?Medicines:             General Anesthesia ?Complications:         No immediate complications. Estimated blood loss: None. ?Procedure:             Pre-Anesthesia Assessment: ?                       - Prior to the procedure, a History and Physical was  ?                       performed, and patient medications and allergies were  ?                       reviewed. The patient is competent. The risks and  ?                       benefits of the procedure and the sedation options and  ?                       risks were discussed with the patient. All questions  ?                       were answered and informed consent was obtained.  ?                       Patient identification and proposed procedure were  ?                       verified by the physician, the nurse, the  ?                       anesthesiologist, the anesthetist and the technician  ?                       in the pre-procedure area in the procedure room in the  ?                       endoscopy suite. Mental Status Examination: alert and  ?                       oriented. Airway Examination: normal oropharyngeal  ?                       airway and neck mobility. Respiratory Examination:  ?                       clear to auscultation. CV Examination: normal.  ?  Prophylactic Antibiotics: The patient does not require  ?                       prophylactic antibiotics. Prior Anticoagulants: The  ?                        patient has taken no previous anticoagulant or  ?                       antiplatelet agents. ASA Grade Assessment: II - A  ?                       patient with mild systemic disease. After reviewing  ?                       the risks and benefits, the patient was deemed in  ?                       satisfactory condition to undergo the procedure. The  ?                       anesthesia plan was to use general anesthesia.  ?                       Immediately prior to administration of medications,  ?                       the patient was re-assessed for adequacy to receive  ?                       sedatives. The heart rate, respiratory rate, oxygen  ?                       saturations, blood pressure, adequacy of pulmonary  ?                       ventilation, and response to care were monitored  ?                       throughout the procedure. The physical status of the  ?                       patient was re-assessed after the procedure. ?                       After obtaining informed consent, the endoscope was  ?                       passed under direct vision. Throughout the procedure,  ?                       the patient's blood pressure, pulse, and oxygen  ?                       saturations were monitored continuously. The Endoscope  ?                       was introduced through the mouth, and advanced to the  ?  second part of duodenum. The upper GI endoscopy was  ?                       accomplished without difficulty. The patient tolerated  ?                       the procedure well. ?Findings: ?     The esophagus, gastroesophageal junction and examined esophagus were  ?     normal. ?     The stomach was normal. Biopsies were taken with a cold forceps for  ?     histology. ?     The examined duodenum was normal. ?     The cardia and gastric fundus were normal on retroflexion. ?     Esophagogastric landmarks were identified: the gastroesophageal junction  ?     was found at  35 cm from the incisors. ?Impression:            - Normal esophagus and gastroesophageal junction. ?                       - Normal stomach. Biopsied. ?                       - Normal examined duodenum. ?                       - Esophagogastric landmarks identified. ?Recommendation:        - Await pathology results. ?                       - Resume regular diet today. ?                       - Continue present medications. ?                       - Return to my office as previously scheduled. ?Procedure Code(s):     --- Professional --- ?                       [phone], Esophagogastroduodenoscopy, flexible,  ?                       transoral; with biopsy, single or multiple ?Diagnosis Code(s):     --- Professional --- ?                       R10.13, Epigastric pain ?CPT copyright 2019 American Medical Association. All rights reserved. ?The codes documented in this report are preliminary and upon coder review may  ?be revised to meet current compliance requirements. ?Dr. Libby Maw ?Marvetta Vohs Alger Memos MD, MD ?04/25/2021 9:52:38 AM ?This report has been signed electronically. ?Number of Addenda: 0 ?Note Initiated On: 04/25/2021 9:38 AM ?Estimated Blood Loss:  Estimated blood loss: none. ?     [facility] ?

## 2021-04-25 NOTE — Transfer of Care (Signed)
Immediate Anesthesia Transfer of Care Note ? ?Patient: Kaitlin Ford ? ?Procedure(s) Performed: ESOPHAGOGASTRODUODENOSCOPY (EGD) WITH PROPOFOL ? ?Patient Location: Endoscopy Unit ? ?Anesthesia Type:General ? ?Level of Consciousness: drowsy ? ?Airway & Oxygen Therapy: Patient Spontanous Breathing ? ?Post-op Assessment: Report given to RN and Post -op Vital signs reviewed and stable ? ?Post vital signs: Reviewed and stable ? ?Last Vitals:  ?Vitals Value Taken Time  ?BP    ?Temp    ?Pulse 80 04/25/21 0954  ?Resp 18 04/25/21 0954  ?SpO2 100 % 04/25/21 0954  ?Vitals shown include unvalidated device data. ? ?Last Pain:  ?Vitals:  ? 04/25/21 0924  ?TempSrc: Temporal  ?PainSc: 0-No pain  ?   ? ?  ? ?Complications: No notable events documented. ?

## 2021-04-26 ENCOUNTER — Encounter: Payer: Self-pay | Admitting: Gastroenterology

## 2021-04-26 LAB — SURGICAL PATHOLOGY

## 2021-04-27 DIAGNOSIS — Z419 Encounter for procedure for purposes other than remedying health state, unspecified: Secondary | ICD-10-CM | POA: Diagnosis not present

## 2021-04-29 ENCOUNTER — Telehealth: Payer: Self-pay

## 2021-04-29 DIAGNOSIS — R1010 Upper abdominal pain, unspecified: Secondary | ICD-10-CM

## 2021-04-29 DIAGNOSIS — R1013 Epigastric pain: Secondary | ICD-10-CM

## 2021-04-29 NOTE — Telephone Encounter (Signed)
Informed patient and she verbalized understanding.  

## 2021-04-29 NOTE — Telephone Encounter (Signed)
Called and left a message for call back  

## 2021-04-29 NOTE — Telephone Encounter (Signed)
-----   Message from Toney Reil, MD sent at 04/26/2021 12:20 PM EDT ----- ?Gastric biopsies came back negative.  Recommend gastric emptying study ? ?RV ?

## 2021-04-29 NOTE — Telephone Encounter (Signed)
Got gastric emptying study schedule for 05/17/21 [facility] for a 8:00am scan  ?Stop dicyclomine, Reglan, Elavil  after midnight. Nothing to eat or drink after midnight  ?

## 2021-05-02 DIAGNOSIS — R109 Unspecified abdominal pain: Secondary | ICD-10-CM | POA: Diagnosis not present

## 2021-05-06 ENCOUNTER — Telehealth: Payer: Self-pay | Admitting: Certified Nurse Midwife

## 2021-05-06 NOTE — Telephone Encounter (Signed)
Pt had disability forms faxed to [place] - did not receive until 4-10 as office was closed for good Friday. Contacted pt to ask what dx forms were in regards to she made me aware if was for sever abdominal pain. I looked in patient chart she was referred to [facility] for abdominal pain and was being evaluated there. I made pt aware that since she was receiving care for this dx from GI she would need to have forms completed by GI. I spoke with a nurse at GI who stated that they would not complete disability for this dx I left message making pt aware and asked her to reach out to her provider at [facility] for further guidance.  ?

## 2021-05-16 ENCOUNTER — Other Ambulatory Visit: Payer: Self-pay

## 2021-05-16 ENCOUNTER — Telehealth: Payer: Self-pay | Admitting: Gastroenterology

## 2021-05-16 DIAGNOSIS — R1013 Epigastric pain: Secondary | ICD-10-CM

## 2021-05-16 NOTE — Telephone Encounter (Signed)
Lupita Leash from [facility] left a vm for Dr Allegra Lai stating that an order needs to be placed to a urine culture pregnancy test for gastric emptying study 05/17/2021. ?  ?Fax: [phone] ?Phone: [phone] ?

## 2021-05-16 NOTE — Telephone Encounter (Signed)
I have placed referral and faxed it  ?

## 2021-05-17 ENCOUNTER — Ambulatory Visit
Admission: RE | Admit: 2021-05-17 | Discharge: 2021-05-17 | Disposition: A | Discharge status: Home / Self Care | Payer: Medicaid Other | Source: Ambulatory Visit | Attending: Gastroenterology | Admitting: Gastroenterology

## 2021-05-17 ENCOUNTER — Other Ambulatory Visit
Admission: RE | Admit: 2021-05-17 | Discharge: 2021-05-17 | Disposition: A | Discharge status: Home / Self Care | Payer: Medicaid Other | Source: Ambulatory Visit | Attending: Gastroenterology | Admitting: Gastroenterology

## 2021-05-17 DIAGNOSIS — R1010 Upper abdominal pain, unspecified: Secondary | ICD-10-CM | POA: Diagnosis not present

## 2021-05-17 DIAGNOSIS — K30 Functional dyspepsia: Secondary | ICD-10-CM | POA: Diagnosis not present

## 2021-05-17 DIAGNOSIS — R1013 Epigastric pain: Secondary | ICD-10-CM | POA: Diagnosis not present

## 2021-05-17 DIAGNOSIS — Z0389 Encounter for observation for other suspected diseases and conditions ruled out: Secondary | ICD-10-CM | POA: Diagnosis not present

## 2021-05-17 LAB — PREGNANCY, URINE: Preg Test, Ur: NEGATIVE

## 2021-05-17 MED ORDER — TECHNETIUM TC 99M SULFUR COLLOID
2.0000 | Freq: Once | INTRAVENOUS | Status: AC | PRN
  Administered 2021-05-17: 2.05 via INTRAVENOUS

## 2021-05-27 DIAGNOSIS — Z419 Encounter for procedure for purposes other than remedying health state, unspecified: Secondary | ICD-10-CM | POA: Diagnosis not present

## 2021-06-10 ENCOUNTER — Ambulatory Visit (INDEPENDENT_AMBULATORY_CARE_PROVIDER_SITE_OTHER): Payer: Medicaid Other | Admitting: Obstetrics

## 2021-06-10 VITALS — BP 110/72 | HR 81 | Ht 64.0 in | Wt 106.7 lb

## 2021-06-10 DIAGNOSIS — N750 Cyst of Bartholin's gland: Secondary | ICD-10-CM

## 2021-06-10 NOTE — Progress Notes (Signed)
GYN ENCOUNTER ? ?Subjective ? ?HPI: Kaitlin Ford is a 21 y.o. G0P0000 who presents today for evaluation of a bump and feeling skin in her groin. She reports that she went to the beach last weekend, and she noticed that she a cyst or a boil on her right labia. It started to grow and become painful. She denies fever, red streaks, or exudate. It has started to get smaller but is still present. She also noticed on the both sides of her labia, there was an excessive amount of peeling skin. She denies bleeding or redness. She reports she has recently changed soaps, but denies new exposures to STIs, detergents, or other environmental factors. She denies HSV outbreak and dysuria. ? ?Past Medical History:  ?Diagnosis Date  ? Bacterial vaginitis   ? Chlamydia   ? PCR DNA positive for HSV1   ? PCR DNA positive for HSV2   ? ?Past Surgical History:  ?Procedure Laterality Date  ? cyst left wrist removal    ? 2022  ? ESOPHAGOGASTRODUODENOSCOPY (EGD) WITH PROPOFOL N/A 04/25/2021  ? Procedure: ESOPHAGOGASTRODUODENOSCOPY (EGD) WITH PROPOFOL;  Surgeon: Toney Reil, MD;  Location: [facility];  Service: Gastroenterology;  Laterality: N/A;  ? ?OB History   ? ? Gravida  ?0  ? Para  ?0  ? Term  ?0  ? Preterm  ?0  ? AB  ?0  ? Living  ?0  ?  ? ? SAB  ?0  ? IAB  ?0  ? Ectopic  ?0  ? Multiple  ?0  ? Live Births  ?0  ?   ?  ?  ? ?No Known Allergies ? ?ROS negative except as noted in HPI ? ? ?Objective ? ?BP 110/72   Pulse 81   Ht 5\' 4"  (1.626 m)   Wt 106 lb 11.2 oz (48.4 kg)   LMP  (LMP Unknown)   BMI 18.32 kg/m?  ? ?Pelvic: External genitalia normal, no unusual discharge or redness. Pea-sized round, firm, mobile cyst noted in lower part of right inner labia. No redness, erythema, or exudate. ? ?Assessment ?1) Bartholin's gland cyst ? ?Plan ?1) Warm soaks, compresses, keep area clean and dry. Reviewed physiology. Return if swelling worsens or if it becomes more painful.  ? ? ? , CNM ? ? ?  ?

## 2021-06-27 DIAGNOSIS — Z419 Encounter for procedure for purposes other than remedying health state, unspecified: Secondary | ICD-10-CM | POA: Diagnosis not present

## 2021-07-15 ENCOUNTER — Telehealth: Payer: Medicaid Other | Admitting: Gastroenterology

## 2021-07-27 DIAGNOSIS — Z419 Encounter for procedure for purposes other than remedying health state, unspecified: Secondary | ICD-10-CM | POA: Diagnosis not present

## 2021-08-05 IMAGING — CT CT ABDOMEN AND PELVIS WITH CONTRAST
2 of 4 series · 15 of 46 positions shown, 17 images · IV contrast (APPLIED)
Comparison: None.

CLINICAL DATA: Intermittent abdominal pain for the past 3 days,
worse for the past 2 days. Cloudy urine with rare bacteria and
moderate leukocytes. Normal blood white blood cell count.
Essentially normal liver function tests and bilirubin.

EXAM:
CT ABDOMEN AND PELVIS WITH CONTRAST
TECHNIQUE: Multidetector CT imaging of the abdomen and pelvis was performed
using the standard protocol following bolus administration of
intravenous contrast.
CONTRAST:  75mL OMNIPAQUE IOHEXOL 300 MG/ML  SOLN

[Series 2: routine abd/pel with · axial · 0.62mm/px · z∈[-441,-91]mm · 12 of 84 slices shown, 14 images]
[im 7/84  soft-tissue]
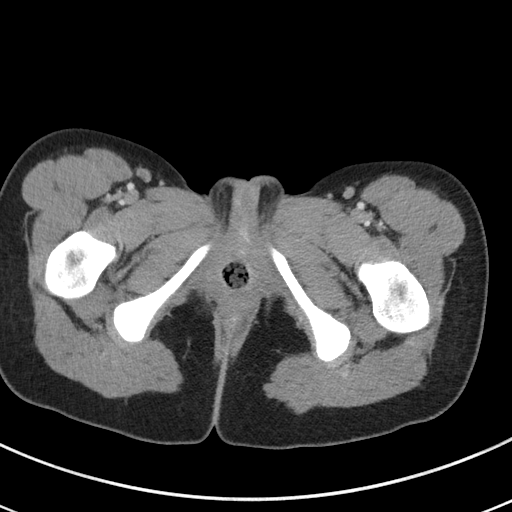
[im 7/84  bone]
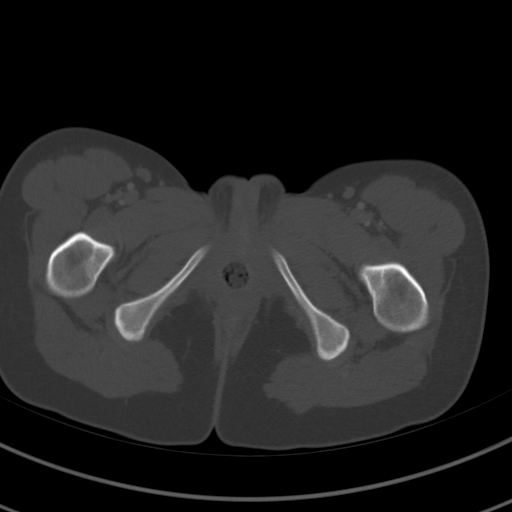
[im 13/84  soft-tissue]
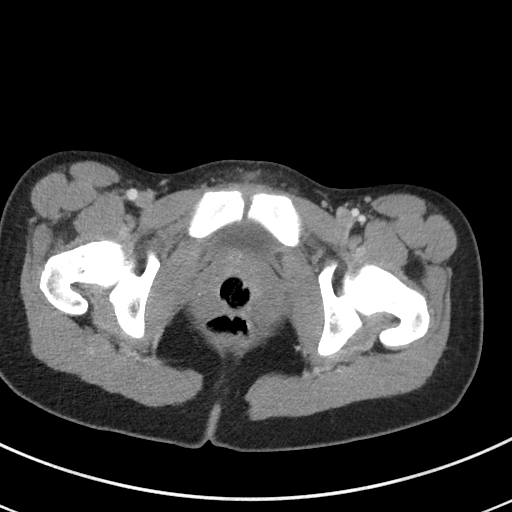
[im 20/84  soft-tissue]
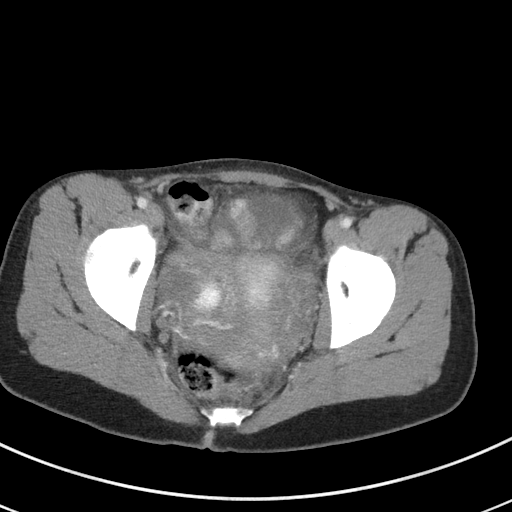
[im 26/84  soft-tissue]
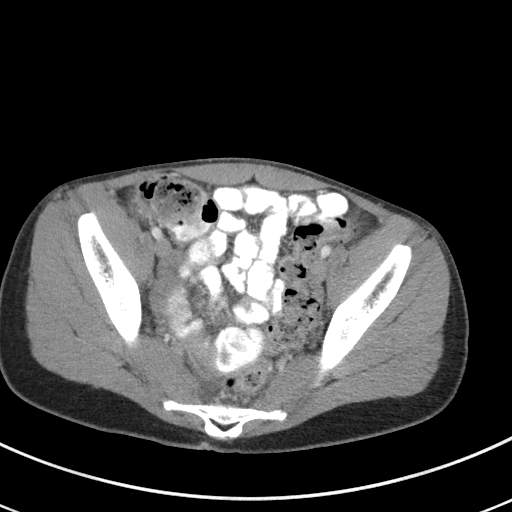
[im 32/84  soft-tissue]
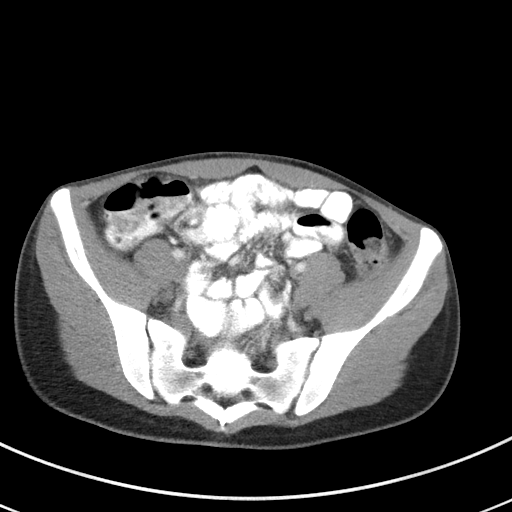
[im 39/84  soft-tissue]
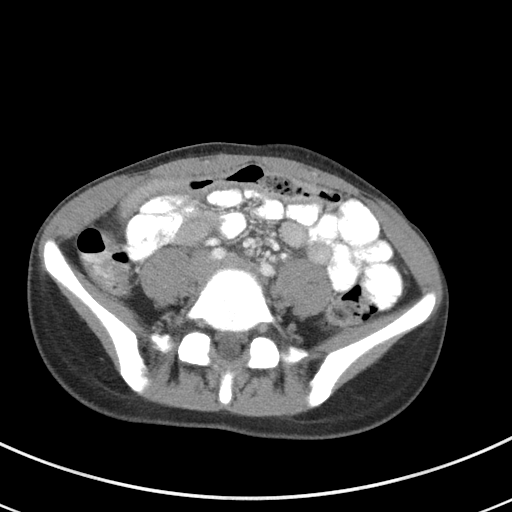
[im 45/84  soft-tissue]
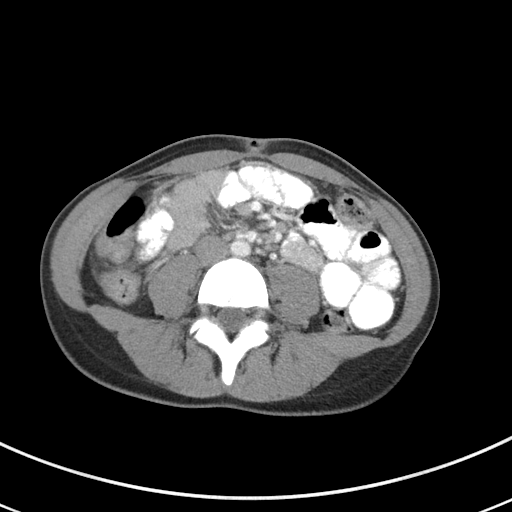
[im 52/84  soft-tissue]
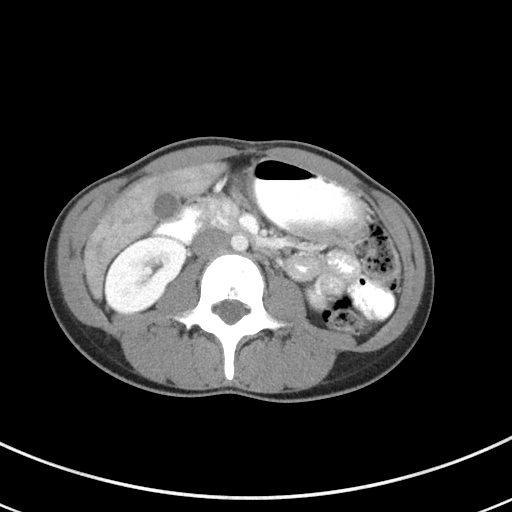
[im 58/84  soft-tissue]
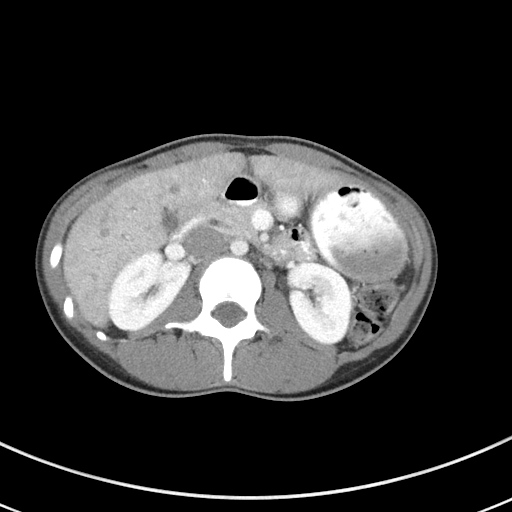
[im 58/84  bone]
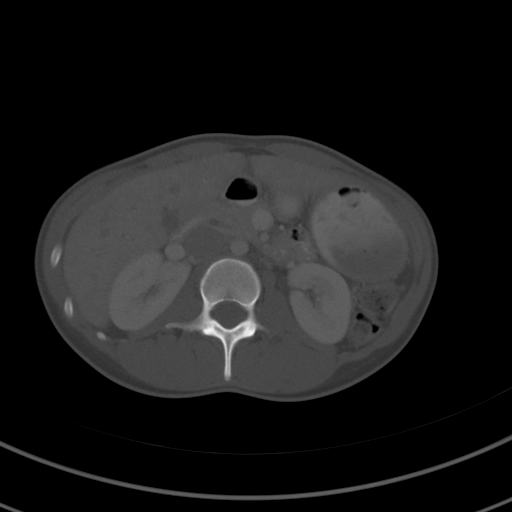
[im 64/84  soft-tissue]
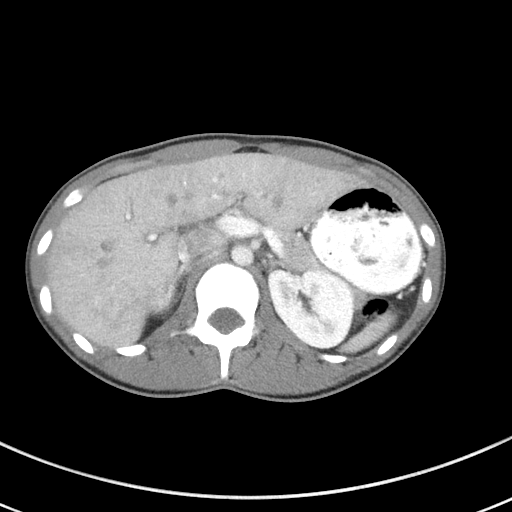
[im 71/84  soft-tissue]
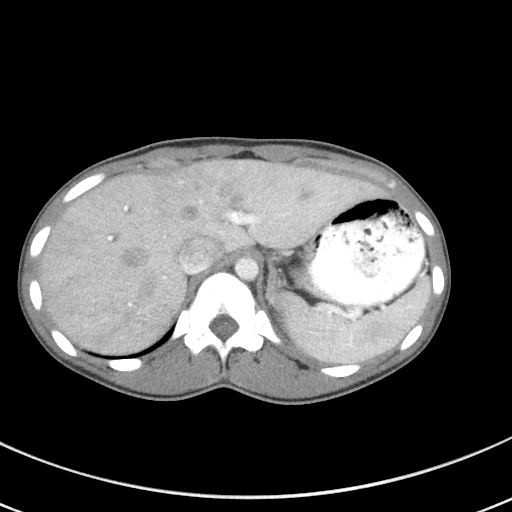
[im 77/84  soft-tissue]
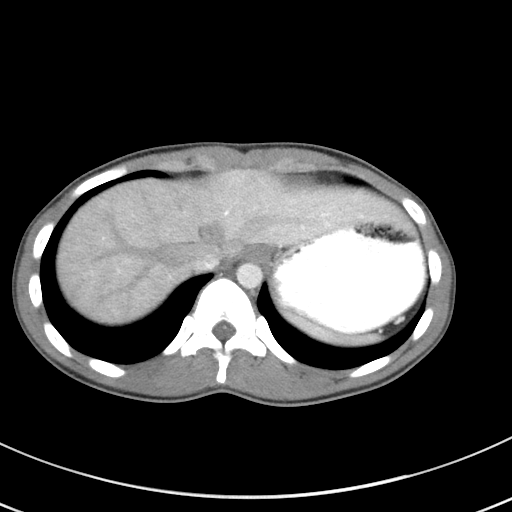

[Series 5: coronal st · coronal · 0.60mm/px · 3 of 71 slices shown]
[im 24/71  soft-tissue]
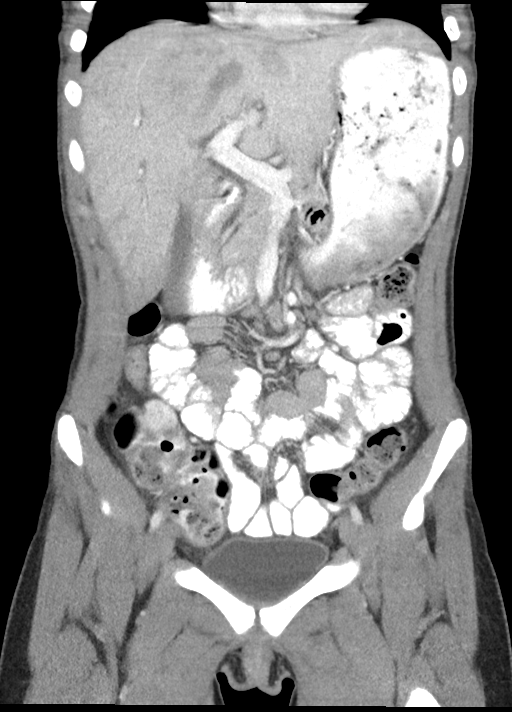
[im 32/71  soft-tissue]
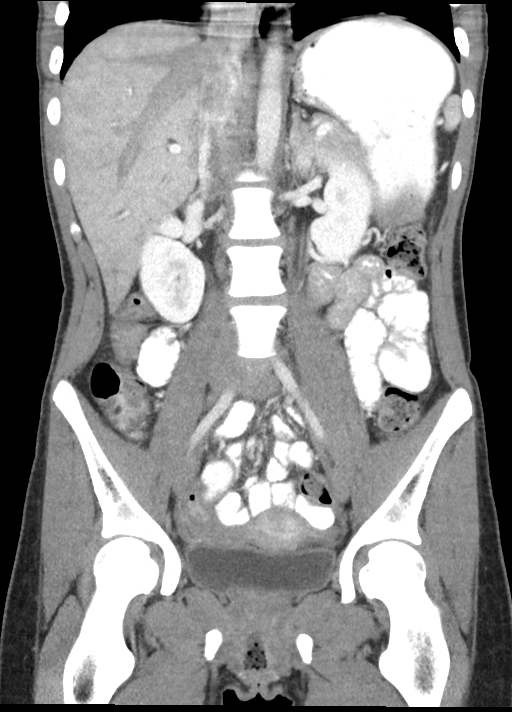
[im 39/71  soft-tissue]
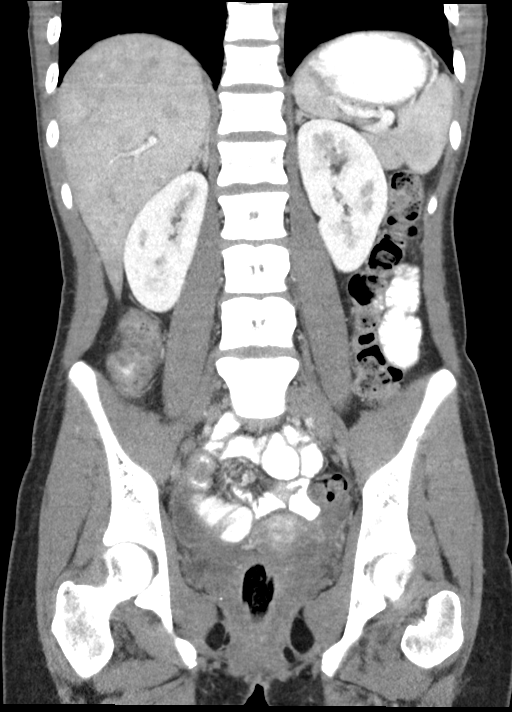

[15 of 46 positions shown; findings below may reference images not displayed]

FINDINGS: Lower chest: Clear lung bases.

Hepatobiliary: Probable noncalcified gallstones in the gallbladder.
No gallbladder wall thickening or pericholecystic fluid. The liver
is diffusely heterogeneous with mild periportal edema and evidence
of mild intrahepatic biliary ductal dilatation. No extrahepatic
biliary ductal dilatation is seen.

Pancreas: Unremarkable. No pancreatic ductal dilatation or
surrounding inflammatory changes.

Spleen: Normal in size without focal abnormality.

Adrenals/Urinary Tract: Adrenal glands are unremarkable. Kidneys are
normal, without renal calculi, focal lesion, or hydronephrosis.
Bladder is unremarkable.

Stomach/Bowel: Unremarkable stomach, small bowel and colon. The
appendix is not well visualized. No gross evidence of appendicitis.

Vascular/Lymphatic: No significant vascular findings are present. No
enlarged abdominal or pelvic lymph nodes.

Reproductive: Normal appearing uterus and ovaries. Tampon in the
vagina.

Other: No abdominal wall hernia or abnormality. No abdominopelvic
ascites.

Musculoskeletal: Normal appearing bones.
IMPRESSION: 1. Heterogeneous liver with mild periportal edema and evidence of
mild intrahepatic biliary ductal dilatation. This could be due to
acute hepatitis. However, in light of the absence of elevation of
the liver function tests and bilirubin, this is most likely a normal
early phase imaging appearance of the liver.
2. Probable cholelithiasis without evidence of cholecystitis.
3. Nonvisualization of the appendix without gross evidence of
appendicitis.
4. Unremarkable urinary tract.

## 2021-08-09 ENCOUNTER — Ambulatory Visit (INDEPENDENT_AMBULATORY_CARE_PROVIDER_SITE_OTHER): Payer: Medicaid Other | Admitting: Certified Nurse Midwife

## 2021-08-09 VITALS — Ht 64.0 in | Wt 106.0 lb

## 2021-08-09 DIAGNOSIS — Z3200 Encounter for pregnancy test, result unknown: Secondary | ICD-10-CM

## 2021-08-09 NOTE — Progress Notes (Signed)
Pt presents for pregnancy confirmation done on 08/09/2021, UPT POSITIVE. G1. Pt states she is not currently taking PNV. EDD 04/05/2022 based on LMP/US of 06/29/21. [redacted]w[redacted]d. Pt to schedule f/u nurse visit, labs, etc. with front desk at checkout. All questions answered.

## 2021-08-13 ENCOUNTER — Ambulatory Visit (INDEPENDENT_AMBULATORY_CARE_PROVIDER_SITE_OTHER): Payer: Medicaid Other

## 2021-08-13 DIAGNOSIS — Z3400 Encounter for supervision of normal first pregnancy, unspecified trimester: Secondary | ICD-10-CM

## 2021-08-13 DIAGNOSIS — Z3480 Encounter for supervision of other normal pregnancy, unspecified trimester: Secondary | ICD-10-CM

## 2021-08-13 DIAGNOSIS — Z3A00 Weeks of gestation of pregnancy not specified: Secondary | ICD-10-CM

## 2021-08-13 NOTE — Progress Notes (Signed)
New OB Intake  I connected with  Lucky Rathke on 08/13/21 at 11:15 AM EDT by telephone Video Visit and verified that I am speaking with the correct person using two identifiers. Nurse is located at [facility] and pt is located at home.  I explained I am completing New OB Intake today. We discussed her EDD of unknown that is based on LMP of unknown. Pt is G1/P0. I reviewed her allergies, medications, Medical/Surgical/OB history, and appropriate screenings. Based on history, this is a/an pregnancy uncomplicated .   Patient Active Problem List   Diagnosis Date Noted   Supervision of other normal pregnancy, antepartum 08/13/2021   Irregular bleeding 07/27/2019    Concerns addressed today None  Delivery Plans:  Plans to deliver at [facility].  Anatomy US Explained first u/s will be scheduled for dating and an anatomy scan will be done at 20wks.   Labs Discussed genetic screening with patient. Patient desires genetic testing to be drawn after [redacted]wk GA.  Discussed possible labs to be drawn at new OB appointment.  COVID Vaccine Patient has not had COVID vaccine.   Social Determinants of Health Food Insecurity: denies food insecurity Transportation: Patient denies transportation needs. Childcare: Discussed no children allowed at ultrasound appointments.   First visit review I reviewed new OB appt with pt. I explained she will have ob bloodwork and pap smear/pelvic exam if indicated. Explained pt will be seen by Doreene Burke, CNM at first visit; encounter routed to appropriate provider.   Loran Senters, [facility] 08/13/2021  11:40 AM  Clinical Staff Provider  Office Location  [facility] Dating    Language  English Anatomy US    Flu Vaccine  Offer/HD? Genetic Screen  NIPS:   TDaP vaccine   01/2021 Hgb A1C or  GTT Early : Third trimester :   Covid declines   LAB RESULTS   Rhogam   Blood Type     Feeding Plan Breast Antibody    Contraception undecided Rubella     Circumcision yes RPR Non Reactive (01/10 1138)   Pediatrician  undecided HBsAg Negative (01/10 1138)   Support Person FOB HIV Non Reactive (01/10 1138)  Prenatal Classes yes Varicella     GBS  (For PCN allergy, check sensitivities)   BTL Consent  Hep C   VBAC Consent  Pap      Hgb Electro      CF      SMA

## 2021-08-14 LAB — POCT URINE PREGNANCY: Preg Test, Ur: POSITIVE — AB

## 2021-08-15 ENCOUNTER — Other Ambulatory Visit: Payer: Self-pay | Admitting: Obstetrics

## 2021-08-15 ENCOUNTER — Telehealth: Payer: Self-pay | Admitting: Obstetrics

## 2021-08-15 MED ORDER — PRENATAL PLUS VITAMIN/MINERAL 27-1 MG PO TABS: 1.0000 | ORAL_TABLET | Freq: Every day | ORAL | 6 refills | Status: DC

## 2021-08-15 MED ORDER — ONDANSETRON HCL 4 MG PO TABS: 4.0000 mg | ORAL_TABLET | Freq: Three times a day (TID) | ORAL | 0 refills | Status: DC | PRN

## 2021-08-15 MED ORDER — PROMETHAZINE HCL 25 MG PO TABS: 25.0000 mg | ORAL_TABLET | Freq: Four times a day (QID) | ORAL | 0 refills | Status: DC | PRN

## 2021-08-15 NOTE — Telephone Encounter (Signed)
Pt called and states that she is having really bad sickness. States she cannot keep anything down including water. Pt states she tried to pick up her RX for zofran and prenatal vitamins but pharmacy states they do not have a RX for her. Pt would like the RX sent to the [facility] in [place]. Please advise.

## 2021-08-19 ENCOUNTER — Other Ambulatory Visit: Payer: Medicaid Other

## 2021-08-19 ENCOUNTER — Ambulatory Visit (INDEPENDENT_AMBULATORY_CARE_PROVIDER_SITE_OTHER): Payer: Medicaid Other

## 2021-08-19 DIAGNOSIS — Z3A01 Less than 8 weeks gestation of pregnancy: Secondary | ICD-10-CM | POA: Diagnosis not present

## 2021-08-19 DIAGNOSIS — Z3481 Encounter for supervision of other normal pregnancy, first trimester: Secondary | ICD-10-CM | POA: Diagnosis not present

## 2021-08-19 DIAGNOSIS — Z0283 Encounter for blood-alcohol and blood-drug test: Secondary | ICD-10-CM

## 2021-08-19 DIAGNOSIS — Z3401 Encounter for supervision of normal first pregnancy, first trimester: Secondary | ICD-10-CM

## 2021-08-19 DIAGNOSIS — Z3480 Encounter for supervision of other normal pregnancy, unspecified trimester: Secondary | ICD-10-CM

## 2021-08-20 LAB — URINALYSIS, ROUTINE W REFLEX MICROSCOPIC
Bilirubin, UA: NEGATIVE
Glucose, UA: NEGATIVE
Ketones, UA: NEGATIVE
Leukocytes,UA: NEGATIVE
Nitrite, UA: NEGATIVE
RBC, UA: NEGATIVE
Specific Gravity, UA: 1.02 (ref 1.005–1.030)
Urobilinogen, Ur: 0.2 mg/dL (ref 0.2–1.0)
pH, UA: >=9 — AB (ref 5.0–7.5)

## 2021-08-20 LAB — MICROSCOPIC EXAMINATION
Casts: NONE SEEN /lpf
Epithelial Cells (non renal): >10 /hpf — AB (ref 0–10)
RBC, Urine: NONE SEEN /hpf (ref 0–2)

## 2021-08-20 LAB — CBC/D/PLT+RPR+RH+ABO+RUBIGG...
Antibody Screen: NEGATIVE
Basophils Absolute: 0 10*3/uL (ref 0.0–0.2)
Basos: 0 %
EOS (ABSOLUTE): 0 10*3/uL (ref 0.0–0.4)
Eos: 0 %
HCV Ab: NONREACTIVE
HIV Screen 4th Generation wRfx: NONREACTIVE
Hematocrit: 38.5 % (ref 34.0–46.6)
Hemoglobin: 12.8 g/dL (ref 11.1–15.9)
Hepatitis B Surface Ag: NEGATIVE
Immature Grans (Abs): 0 10*3/uL (ref 0.0–0.1)
Immature Granulocytes: 0 %
Lymphocytes Absolute: 1.8 10*3/uL (ref 0.7–3.1)
Lymphs: 26 %
MCH: 31.1 pg (ref 26.6–33.0)
MCHC: 33.2 g/dL (ref 31.5–35.7)
MCV: 93 fL (ref 79–97)
Monocytes Absolute: 0.6 10*3/uL (ref 0.1–0.9)
Monocytes: 9 %
Neutrophils Absolute: 4.4 10*3/uL (ref 1.4–7.0)
Neutrophils: 65 %
Platelets: 333 10*3/uL (ref 150–450)
RBC: 4.12 x10E6/uL (ref 3.77–5.28)
RDW: 12.2 % (ref 11.7–15.4)
RPR Ser Ql: NONREACTIVE
Rh Factor: POSITIVE
Rubella Antibodies, IGG: 6.56 index (ref >0.99)
Varicella zoster IgG: 1175 index (ref >165)
WBC: 6.8 10*3/uL (ref 3.4–10.8)

## 2021-08-20 LAB — HCV INTERPRETATION

## 2021-08-21 ENCOUNTER — Other Ambulatory Visit: Payer: Self-pay | Admitting: Certified Nurse Midwife

## 2021-08-21 DIAGNOSIS — O3680X0 Pregnancy with inconclusive fetal viability, not applicable or unspecified: Secondary | ICD-10-CM

## 2021-08-21 LAB — URINE CULTURE, OB REFLEX

## 2021-08-21 LAB — CULTURE, OB URINE

## 2021-08-22 LAB — GC/CHLAMYDIA PROBE AMP
Chlamydia trachomatis, NAA: NEGATIVE
Neisseria Gonorrhoeae by PCR: NEGATIVE

## 2021-08-26 ENCOUNTER — Other Ambulatory Visit: Payer: Medicaid Other

## 2021-08-27 DIAGNOSIS — Z419 Encounter for procedure for purposes other than remedying health state, unspecified: Secondary | ICD-10-CM | POA: Diagnosis not present

## 2021-08-28 LAB — MONITOR DRUG PROFILE 14(MW)

## 2021-08-28 LAB — NICOTINE SCREEN, URINE

## 2021-08-30 ENCOUNTER — Telehealth: Payer: Self-pay | Admitting: Obstetrics

## 2021-08-30 ENCOUNTER — Other Ambulatory Visit: Payer: Self-pay | Admitting: Obstetrics

## 2021-08-30 DIAGNOSIS — R112 Nausea with vomiting, unspecified: Secondary | ICD-10-CM

## 2021-08-30 NOTE — Telephone Encounter (Signed)
Patient called and requested a refill on her Zofran. Please advise

## 2021-08-30 NOTE — Telephone Encounter (Signed)
Called pt to let her know I refilled her zoran to pharmacy on file. Pt verbalized understanding.

## 2021-09-02 ENCOUNTER — Ambulatory Visit (INDEPENDENT_AMBULATORY_CARE_PROVIDER_SITE_OTHER): Payer: Medicaid Other

## 2021-09-02 DIAGNOSIS — O3680X0 Pregnancy with inconclusive fetal viability, not applicable or unspecified: Secondary | ICD-10-CM | POA: Diagnosis not present

## 2021-09-02 DIAGNOSIS — Z3A08 8 weeks gestation of pregnancy: Secondary | ICD-10-CM

## 2021-09-17 ENCOUNTER — Other Ambulatory Visit: Payer: Self-pay

## 2021-09-17 DIAGNOSIS — R112 Nausea with vomiting, unspecified: Secondary | ICD-10-CM

## 2021-09-17 MED ORDER — ONDANSETRON HCL 4 MG PO TABS: ORAL_TABLET | ORAL | 0 refills | Status: DC

## 2021-09-17 NOTE — Telephone Encounter (Signed)
Unable to LVM medication is sent to [facility] on file.

## 2021-09-17 NOTE — Telephone Encounter (Signed)
Patient is calling needing another refill on her zofran. Please advise?

## 2021-09-18 ENCOUNTER — Encounter: Payer: Self-pay | Admitting: Obstetrics

## 2021-09-18 ENCOUNTER — Ambulatory Visit (INDEPENDENT_AMBULATORY_CARE_PROVIDER_SITE_OTHER): Payer: Medicaid Other | Admitting: Obstetrics

## 2021-09-18 ENCOUNTER — Other Ambulatory Visit: Payer: Medicaid Other

## 2021-09-18 VITALS — BP 110/71 | HR 118 | Wt 110.0 lb

## 2021-09-18 DIAGNOSIS — F419 Anxiety disorder, unspecified: Secondary | ICD-10-CM

## 2021-09-18 DIAGNOSIS — Z1379 Encounter for other screening for genetic and chromosomal anomalies: Secondary | ICD-10-CM

## 2021-09-18 DIAGNOSIS — Z3401 Encounter for supervision of normal first pregnancy, first trimester: Secondary | ICD-10-CM

## 2021-09-18 LAB — POCT URINALYSIS DIPSTICK OB
Bilirubin, UA: NEGATIVE
Blood, UA: NEGATIVE
Glucose, UA: NEGATIVE
Ketones, UA: NEGATIVE
Leukocytes, UA: NEGATIVE
Nitrite, UA: NEGATIVE
POC,PROTEIN,UA: NEGATIVE
Spec Grav, UA: 1.02 (ref 1.010–1.025)
Urobilinogen, UA: 0.2 E.U./dL
pH, UA: 6 (ref 5.0–8.0)

## 2021-09-18 NOTE — Progress Notes (Signed)
NEW OB HISTORY AND PHYSICAL  SUBJECTIVE:       Kaitlin Ford is a 21 y.o. G1P0000 female, No LMP recorded (lmp unknown). Patient is pregnant., Estimated Date of Delivery: 04/11/22, [redacted]w[redacted]d, presents today for establishment of Prenatal Care. She reports nausea and daily vomiting, fatigue, and breast tenderness. She is feeling ambivalent about continuing the pregnancy.  Social history Partner/Relationship: FOB in jail. Unsure about their future together. 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 Exercise: walking Substance use: denies EtOH,tobacco, vape, reports she has quit marijuana   Gynecologic History No LMP recorded (lmp unknown). Patient is pregnant. Normal Contraception: none Last Pap: N/A (age 24)  Obstetric History OB History  Gravida Para Term Preterm AB Living  1 0 0 0 0 0  SAB IAB Ectopic Multiple Live Births  0 0 0 0 0    # Outcome Date GA Lbr Len/2nd Weight Sex Delivery Anes PTL Lv  1 Current             Past Medical History:  Diagnosis Date   Bacterial vaginitis    Chlamydia    PCR DNA positive for HSV1    PCR DNA positive for HSV2     Past Surgical History:  Procedure Laterality Date   cyst left wrist removal     2022   ESOPHAGOGASTRODUODENOSCOPY (EGD) WITH PROPOFOL N/A 04/25/2021   Procedure: ESOPHAGOGASTRODUODENOSCOPY (EGD) WITH PROPOFOL;  Surgeon: Toney Reil, MD;  Location: [facility] ENDOSCOPY;  Service: Gastroenterology;  Laterality: N/A;    Current Outpatient Medications on File Prior to Visit  Medication Sig Dispense Refill   ondansetron (ZOFRAN) 4 MG tablet TAKE 1 TABLET BY MOUTH EVERY 8 HOURS AS NEEDED FOR NAUSEA AND VOMITING 20 tablet 0   Prenatal Vit-Fe Fumarate-FA (PRENATAL PLUS VITAMIN/MINERAL) 27-1 MG TABS Take 1 tablet by mouth daily. 30 tablet 6   promethazine (PHENERGAN) 25 MG tablet Take 1 tablet (25 mg total) by mouth every 6 (six) hours as needed for nausea or vomiting. 15 tablet 0   No current  facility-administered medications on file prior to visit.    No Known Allergies  Social History   Socioeconomic History   Marital status: Significant Other    Spouse name: Not on file   Number of children: 0   Years of education: 13   Highest education level: Not on file  Occupational History   Occupation: student  Tobacco Use   Smoking status: Never   Smokeless tobacco: Never  Vaping Use   Vaping Use: Former  Substance and Sexual Activity   Alcohol use: Not Currently    Comment: occasionally   Drug use: Not Currently    Types: Marijuana   Sexual activity: Yes    Partners: Male    Birth control/protection: None  Other Topics Concern   Not on file  Social History Narrative   Not on file   Social Determinants of Health   Financial Resource Strain: Medium Risk (08/13/2021)   Overall Financial Resource Strain (CARDIA)    Difficulty of Paying Living Expenses: Somewhat hard  Food Insecurity: No Food Insecurity (08/13/2021)   Hunger Vital Sign    Worried About Running Out of Food in the Last Year: Never true    Ran Out of Food in the Last Year: Never true  Transportation Needs: No Transportation Needs (08/13/2021)   PRAPARE - Administrator, Civil Service (Medical): No    Lack of Transportation (Non-Medical): No  Physical Activity: Insufficiently Active (08/13/2021)  Exercise Vital Sign    Days of Exercise per Week: 3 days    Minutes of Exercise per Session: 30 min  Stress: No Stress Concern Present (08/13/2021)   [employer] of Occupational Health - Occupational Stress Questionnaire    Feeling of Stress : Not at all  Social Connections: Unknown (08/13/2021)   Social Connection and Isolation Panel [NHANES]    Frequency of Communication with Friends and Family: More than three times a week    Frequency of Social Gatherings with Friends and Family: Once a week    Attends Religious Services: Never    Database administrator or Organizations: No    Attends  Banker Meetings: Never    Marital Status: Not on file  Intimate Partner Violence: Not At Risk (08/13/2021)   Humiliation, Afraid, Rape, and Kick questionnaire    Fear of Current or Ex-Partner: No    Emotionally Abused: No    Physically Abused: No    Sexually Abused: No    Family History  Problem Relation Age of Onset   Seizures Mother    Asthma Sister    Asthma Brother    Cancer Maternal Grandmother        kidney    The following portions of the patient's history were reviewed and updated as appropriate: allergies, current medications, past OB history, past medical history, past surgical history, past family history, past social history, and problem list.  History obtained from the patient General ROS: negative for - chills, fever, or hot flashes Psychological ROS: positive for - anxiety and depression Ophthalmic ROS: positive for - blurry vision (does not wear her prescribed glasses)  ENT ROS: negative for - headaches or sore throat Hematological and Lymphatic ROS: negative for - bleeding problems, bruising, or swollen lymph nodes Endocrine ROS: negative Breast ROS: negative for breast lumps Respiratory ROS: no cough, shortness of breath, or wheezing Cardiovascular ROS: no chest pain or dyspnea on exertion Gastrointestinal ROS: constipation, abdominal pain with bowel movement Genito-Urinary ROS: no dysuria, trouble voiding, or hematuria Musculoskeletal ROS: negative Dermatological ROS: negative   OBJECTIVE: Initial Physical Exam (New OB)  GENERAL APPEARANCE: alert, well appearing, in no apparent distress, pacing during visit HEAD: normocephalic, atraumatic MOUTH: mucous membranes moist, pharynx normal without lesions THYROID: no thyromegaly or masses present BREASTS: no masses noted, no significant tenderness, no palpable axillary nodes, no skin changes LUNGS: clear to auscultation, no wheezes, rales or rhonchi, symmetric air entry HEART: regular rate and  rhythm, no murmurs ABDOMEN: soft, nontender, nondistended, no abnormal masses, no epigastric pain, mild suprapubic tenderness, fundus soft, nontender 10 weeks size, FHT present (166 bpm) EXTREMITIES: no redness or tenderness in the calves or thighs SKIN: normal coloration and turgor, no rashes LYMPH NODES: no adenopathy palpable NEUROLOGIC: alert, oriented, normal speech, no focal findings or movement disorder noted  PELVIC EXAM EXTERNAL GENITALIA: normal appearing vulva with no masses, tenderness or lesions VAGINA: no abnormal discharge or lesions UTERUS: gravid and consistent with 10 weeks OB EXAM PELVIMETRY: appears adequate  ASSESSMENT: Normal pregnancy [redacted]w[redacted]d   PLAN: Routine prenatal care. Mylei is unsure if she wants to continue the pregnancy. Discussed [place] laws and time limits. We discussed an overview of prenatal care and when to call. Reviewed diet, exercise, and weight gain recommendations in pregnancy. Discussed benefits of breastfeeding and lactation resources at [facility]. Referral to behavioral health for counseling. Encouraged to work on building her social support system. I reviewed labs and answered all questions.  See orders  Lloyd Huger, CNM

## 2021-09-18 NOTE — Addendum Note (Signed)
Addended by: Guadlupe Spanish on: 09/18/2021 12:53 PM   Modules accepted: Orders

## 2021-09-18 NOTE — Addendum Note (Signed)
Addended by: Loney Laurence on: 09/18/2021 03:22 PM   Modules accepted: Orders

## 2021-09-22 ENCOUNTER — Encounter: Payer: Self-pay | Admitting: Obstetrics

## 2021-09-22 LAB — MATERNIT21  PLUS CORE+ESS+SCA, BLOOD
11q23 deletion (Jacobsen): NOT DETECTED
15q11 deletion (PW Angelman): NOT DETECTED
1p36 deletion syndrome: NOT DETECTED
22q11 deletion (DiGeorge): NOT DETECTED
4p16 deletion(Wolf-Hirschhorn): NOT DETECTED
5p15 deletion (Cri-du-chat): NOT DETECTED
8q24 deletion (Langer-Giedion): NOT DETECTED
Fetal Fraction: 10
Monosomy X (Turner Syndrome): NOT DETECTED
Result (T21): NEGATIVE
Trisomy 13 (Patau syndrome): NEGATIVE
Trisomy 16: NOT DETECTED
Trisomy 18 (Edwards syndrome): NEGATIVE
Trisomy 21 (Down syndrome): NEGATIVE
Trisomy 22: NOT DETECTED
XXX (Triple X Syndrome): NOT DETECTED
XXY (Klinefelter Syndrome): NOT DETECTED
XYY (Jacobs Syndrome): NOT DETECTED

## 2021-09-27 DIAGNOSIS — Z419 Encounter for procedure for purposes other than remedying health state, unspecified: Secondary | ICD-10-CM | POA: Diagnosis not present

## 2021-10-16 ENCOUNTER — Encounter: Payer: Medicaid Other | Admitting: Certified Nurse Midwife

## 2021-10-27 DIAGNOSIS — Z419 Encounter for procedure for purposes other than remedying health state, unspecified: Secondary | ICD-10-CM | POA: Diagnosis not present

## 2021-11-27 DIAGNOSIS — Z419 Encounter for procedure for purposes other than remedying health state, unspecified: Secondary | ICD-10-CM | POA: Diagnosis not present

## 2021-12-09 ENCOUNTER — Encounter: Payer: Self-pay | Admitting: Obstetrics and Gynecology

## 2021-12-09 DIAGNOSIS — Z8619 Personal history of other infectious and parasitic diseases: Secondary | ICD-10-CM | POA: Insufficient documentation

## 2021-12-24 ENCOUNTER — Encounter: Payer: Self-pay | Admitting: Certified Nurse Midwife

## 2021-12-24 ENCOUNTER — Ambulatory Visit (INDEPENDENT_AMBULATORY_CARE_PROVIDER_SITE_OTHER): Payer: Medicaid Other | Admitting: Certified Nurse Midwife

## 2021-12-24 VITALS — BP 118/74 | Ht 64.0 in | Wt 113.0 lb

## 2021-12-24 DIAGNOSIS — Z3202 Encounter for pregnancy test, result negative: Secondary | ICD-10-CM

## 2021-12-24 DIAGNOSIS — Z30017 Encounter for initial prescription of implantable subdermal contraceptive: Secondary | ICD-10-CM | POA: Diagnosis not present

## 2021-12-24 LAB — POCT URINE PREGNANCY: Preg Test, Ur: POSITIVE — AB

## 2021-12-24 NOTE — Patient Instructions (Signed)
Nexplanon Instructions After Insertion  Keep bandage clean and dry for 24 hours  May use ice/Tylenol/Ibuprofen for soreness or pain  If you develop fever, drainage or increased warmth from incision site-contact office immediately   

## 2021-12-24 NOTE — Progress Notes (Signed)
  Pt presents today for nexplanon placement. UPT positive for pregnancy. PT does not desire pregnancy. Pt has information on women's choice. Pt encouraged to call and schedule an appointment as soon as possible. Pt verbalizes understanding . Discussed that after procedure to reach out to me ASAP for birth control . She verbalizes and agrees to plan of care.    Kaitlin Ford,CNM

## 2021-12-27 DIAGNOSIS — Z419 Encounter for procedure for purposes other than remedying health state, unspecified: Secondary | ICD-10-CM | POA: Diagnosis not present

## 2022-01-13 ENCOUNTER — Telehealth: Payer: Self-pay

## 2022-01-13 DIAGNOSIS — O200 Threatened abortion: Secondary | ICD-10-CM

## 2022-01-13 NOTE — Telephone Encounter (Signed)
Patient called. She has concerns that she may have had a miscarriage last night. She was bleeding when she went to the bathroom. She got in the shower and put her finger in her vagina and clots and pieces started coming out. She has also had some mild cramping. This is her second pregnancy. She did not continue with the first pregnancy. Came in to get birth control and found out she was pregnant. I advised her to come in for a beta quant. Please advise.

## 2022-01-13 NOTE — Telephone Encounter (Signed)
She also complains of weakness. She can't keep anything down. She can't smell food without getting sick and it is constantly. I advised patient to take vitamin B6 and unisom according to protocol. Also advised her to eat crackers or pretzels before getting out of bed in the mornings. Eat small frequent meals and stay hydrated. She verbalized understanding. She will schedule an appointment for labs for tomorrow. Please advise.

## 2022-01-14 ENCOUNTER — Other Ambulatory Visit: Payer: Medicaid Other

## 2022-01-14 DIAGNOSIS — O200 Threatened abortion: Secondary | ICD-10-CM | POA: Diagnosis not present

## 2022-01-14 NOTE — Telephone Encounter (Signed)
Called patient she said her bleeding has resolved. She has some weakness when she moves around. She feels a little better today.

## 2022-01-15 ENCOUNTER — Telehealth: Payer: Self-pay

## 2022-01-15 ENCOUNTER — Other Ambulatory Visit: Payer: Self-pay | Admitting: Certified Nurse Midwife

## 2022-01-15 DIAGNOSIS — O209 Hemorrhage in early pregnancy, unspecified: Secondary | ICD-10-CM

## 2022-01-15 LAB — BETA HCG QUANT (REF LAB): hCG Quant: 111645 m[IU]/mL

## 2022-01-15 NOTE — Telephone Encounter (Signed)
Patient states she has seen her beta lab results from yesterday, but doesn't understand them and would like someone to advise her. I advised per Annie's note that the lab is elevated suggesting pregnancy, but we need to repeat it Friday to see if the numbers are dropping (suggesting miscarriage). Patient reports she has an appointment at the clinic tomorrow and inquires if there is anyway she can find out today if the pregnancy is viable. Advised I will discuss with provider and contact her back.

## 2022-01-15 NOTE — Telephone Encounter (Signed)
-----   Message from Doreene Burke, [place] sent at 01/15/2022  8:45 AM EST ----- Harry Shuck, Please let her know that her quant is elevated , suggesting pregnancy but we need to repeat it Friday to see if the numbers are dropping ( suggesting miscarriage) . Can we get her in Friday morning for lab draw? I will put in the order.   Thanks,  Pattricia Boss

## 2022-01-16 DIAGNOSIS — O219 Vomiting of pregnancy, unspecified: Secondary | ICD-10-CM | POA: Diagnosis not present

## 2022-01-16 DIAGNOSIS — R0602 Shortness of breath: Secondary | ICD-10-CM | POA: Diagnosis not present

## 2022-01-16 DIAGNOSIS — R109 Unspecified abdominal pain: Secondary | ICD-10-CM | POA: Diagnosis not present

## 2022-01-16 DIAGNOSIS — Z3A01 Less than 8 weeks gestation of pregnancy: Secondary | ICD-10-CM | POA: Diagnosis not present

## 2022-01-16 DIAGNOSIS — O209 Hemorrhage in early pregnancy, unspecified: Secondary | ICD-10-CM | POA: Diagnosis not present

## 2022-01-16 DIAGNOSIS — R1030 Lower abdominal pain, unspecified: Secondary | ICD-10-CM | POA: Diagnosis not present

## 2022-01-16 DIAGNOSIS — O26891 Other specified pregnancy related conditions, first trimester: Secondary | ICD-10-CM | POA: Diagnosis not present

## 2022-01-16 DIAGNOSIS — R5383 Other fatigue: Secondary | ICD-10-CM | POA: Diagnosis not present

## 2022-01-16 DIAGNOSIS — O99891 Other specified diseases and conditions complicating pregnancy: Secondary | ICD-10-CM | POA: Diagnosis not present

## 2022-01-16 NOTE — Telephone Encounter (Signed)
Secure was sent to On Call provider. Prior to response, patient returned call reporting she passed the fetus. See additional telephone encounter.

## 2022-01-17 DIAGNOSIS — Z3A01 Less than 8 weeks gestation of pregnancy: Secondary | ICD-10-CM | POA: Diagnosis not present

## 2022-01-17 DIAGNOSIS — R109 Unspecified abdominal pain: Secondary | ICD-10-CM | POA: Diagnosis not present

## 2022-01-17 DIAGNOSIS — O209 Hemorrhage in early pregnancy, unspecified: Secondary | ICD-10-CM | POA: Diagnosis not present

## 2022-01-17 DIAGNOSIS — O26891 Other specified pregnancy related conditions, first trimester: Secondary | ICD-10-CM | POA: Diagnosis not present

## 2022-01-27 DIAGNOSIS — Z419 Encounter for procedure for purposes other than remedying health state, unspecified: Secondary | ICD-10-CM | POA: Diagnosis not present

## 2022-02-10 ENCOUNTER — Encounter: Payer: Medicaid Other | Admitting: Certified Nurse Midwife

## 2022-02-27 DIAGNOSIS — Z419 Encounter for procedure for purposes other than remedying health state, unspecified: Secondary | ICD-10-CM | POA: Diagnosis not present

## 2022-03-28 DIAGNOSIS — Z419 Encounter for procedure for purposes other than remedying health state, unspecified: Secondary | ICD-10-CM | POA: Diagnosis not present

## 2022-04-09 ENCOUNTER — Ambulatory Visit (INDEPENDENT_AMBULATORY_CARE_PROVIDER_SITE_OTHER): Payer: Medicaid Other | Admitting: Certified Nurse Midwife

## 2022-04-09 ENCOUNTER — Encounter: Payer: Self-pay | Admitting: Certified Nurse Midwife

## 2022-04-09 VITALS — BP 100/62 | HR 76 | Resp 15 | Ht 64.0 in | Wt 115.4 lb

## 2022-04-09 DIAGNOSIS — Z30016 Encounter for initial prescription of transdermal patch hormonal contraceptive device: Secondary | ICD-10-CM | POA: Diagnosis not present

## 2022-04-09 DIAGNOSIS — Z3009 Encounter for other general counseling and advice on contraception: Secondary | ICD-10-CM | POA: Diagnosis not present

## 2022-04-09 DIAGNOSIS — Z3202 Encounter for pregnancy test, result negative: Secondary | ICD-10-CM

## 2022-04-09 LAB — POCT URINE PREGNANCY: Preg Test, Ur: NEGATIVE

## 2022-04-09 MED ORDER — NORELGESTROMIN-ETH ESTRADIOL 150-35 MCG/24HR TD PTWK: 1.0000 | MEDICATED_PATCH | TRANSDERMAL | 12 refills | Status: DC

## 2022-04-09 NOTE — Patient Instructions (Signed)
Hormonal Contraception Information Hormonal contraception is a type of birth control that uses hormones to prevent pregnancy. It usually involves a combination of the hormones estrogen and progesterone, or only the hormone progesterone. Hormonal contraception works in these ways: It thickens the mucus in the cervix, which is the lowest part of the uterus. Thicker mucus makes it harder for sperm to enter the uterus. It changes the lining of the uterus. This makes it harder for an egg to attach or implant. It may stop the ovaries from releasing eggs (ovulation). Some women who take hormonal contraceptives that contain only progesterone may continue to ovulate. Hormonal contraception cannot prevent STIs (sexually transmitted infections). Pregnancy may still occur. Types of hormonal contraception  Estrogen and progesterone contraceptives Contraceptives that use a combination of estrogen and progesterone are available in these forms: Pill. Pills come in different combinations of hormones. Pills must be taken at the same time each day. They can affect your period. You can get your period monthly, once every 3 months, or not at all. Patch. The patch is applied to the buttocks, abdomen, upper outer arm, or back. It is kept in place for 3 weeks. It is removed for the last or fourth week of the cycle. Vaginal ring. The ring is placed in the vagina and left there for 3 weeks. It is then removed for the last or fourth week of the cycle. Progesterone-only contraceptives Contraceptives that use only progesterone are available in these forms: Pill. Pills should be taken at the same time everyday. This is very important to decrease the chance of pregnancy. Pills containing progestin-only are usually taken every day of the cycle. Other types of pills may have a placebo tablet for the last 4 days of every cycle. Intrauterine device (IUD). This device is inserted through the vagina and cervix into the uterus. It is  removed or replaced every 3 to 5 years, depending on the type. It can be removed sooner. Implant. Plastic rods are placed under the skin of the upper arm. They are removed or replaced every 3 years. They can be removed sooner. Shot (injection). The injection is given once every 12 or 13 weeks (about 3 months). Risks associated with hormonal contraception Estrogen and progesterone contraceptives can sometimes cause side effects, such as: Nausea. Headaches. Breast tenderness. Bleeding or spotting between menstrual cycles. High blood pressure (rare). Strokes, heart attacks, or blood clots (rare). Progesterone-only contraceptives also can have side effects, such as: Nausea. Headaches. Breast tenderness. Irregular menstrual bleeding. High blood pressure (rare). Talk to your health care provider about what side effects may mean for you. Questions to ask: What type of hormonal contraception is right for me? How long should I plan to use hormonal contraception? What are the side effects of the hormonal contraception method I choose? How can I prevent STIs while using hormonal contraception? Where to find more information Ask your health care provider for more information and resources about hormonal contraception. You can also go to: U.S. Department of Health and Human Services, Office on Women's Health: www.womenshealth.gov Summary Estrogen and progesterone are hormones used in many forms of birth control. Hormonal contraception cannot prevent STIs (sexually transmitted infections). Talk to your health care provider about what side effects may mean for you. Ask your health care provider for more information and resources about hormonal contraception. This information is not intended to replace advice given to you by your health care provider. Make sure you discuss any questions you have with your health care provider.   Document Revised: 09/21/2019 Document Reviewed: 09/21/2019 Elsevier  Patient Education  2023 Elsevier Inc.  

## 2022-04-09 NOTE — Progress Notes (Signed)
Subjective:    Kaitlin Ford is a 22 y.o. female who presents for contraception counseling. The patient has no complaints today. The patient is sexually active. Pertinent past medical history: none.  Menstrual History: OB History     Gravida  1   Para  0   Term  0   Preterm  0   AB  0   Living  0      SAB  0   IAB  0   Ectopic  0   Multiple  0   Live Births  0            Patient's last menstrual period was 03/24/2022 (approximate).    The following portions of the patient's history were reviewed and updated as appropriate: allergies, current medications, past family history, past medical history, past social history, past surgical history, and problem list.  Review of Systems Pertinent items are noted in HPI.   Objective:    No exam performed today,  not indicated for birth control .   Assessment:    22 y.o., starting Ortho-Evra patches weekly, no contraindications.   Plan:    All questions answered. Reviewed all forms of birth control options available including abstinence; fertility period awareness methods; over the counter/barrier methods; hormonal contraceptive medication including pill, patch, ring, injection,contraceptive implant; hormonal and nonhormonal IUDs. Risks and benefits reviewed.  Questions were answered.   Pt would like to use the patch , discussed cleaning /dry area prior to placement , rotating sites. She verbalizes and agrees. Orders placed x 1 yr.   Philip Aspen, CNM

## 2022-04-28 DIAGNOSIS — Z419 Encounter for procedure for purposes other than remedying health state, unspecified: Secondary | ICD-10-CM | POA: Diagnosis not present

## 2022-05-28 DIAGNOSIS — Z419 Encounter for procedure for purposes other than remedying health state, unspecified: Secondary | ICD-10-CM | POA: Diagnosis not present

## 2022-06-28 DIAGNOSIS — Z419 Encounter for procedure for purposes other than remedying health state, unspecified: Secondary | ICD-10-CM | POA: Diagnosis not present

## 2022-07-14 DIAGNOSIS — S20213A Contusion of bilateral front wall of thorax, initial encounter: Secondary | ICD-10-CM | POA: Diagnosis not present

## 2022-07-14 DIAGNOSIS — R0781 Pleurodynia: Secondary | ICD-10-CM | POA: Diagnosis not present

## 2022-07-14 DIAGNOSIS — R1010 Upper abdominal pain, unspecified: Secondary | ICD-10-CM | POA: Diagnosis not present

## 2022-07-14 DIAGNOSIS — R079 Chest pain, unspecified: Secondary | ICD-10-CM | POA: Diagnosis not present

## 2022-07-14 DIAGNOSIS — R0789 Other chest pain: Secondary | ICD-10-CM | POA: Diagnosis not present

## 2022-07-14 DIAGNOSIS — R10816 Epigastric abdominal tenderness: Secondary | ICD-10-CM | POA: Diagnosis not present

## 2022-07-28 DIAGNOSIS — Z419 Encounter for procedure for purposes other than remedying health state, unspecified: Secondary | ICD-10-CM | POA: Diagnosis not present

## 2022-08-08 ENCOUNTER — Encounter: Payer: Self-pay | Admitting: Certified Nurse Midwife

## 2022-08-08 ENCOUNTER — Ambulatory Visit (INDEPENDENT_AMBULATORY_CARE_PROVIDER_SITE_OTHER): Payer: Medicaid Other

## 2022-08-08 ENCOUNTER — Other Ambulatory Visit (HOSPITAL_COMMUNITY)
Admission: RE | Admit: 2022-08-08 | Discharge: 2022-08-08 | Disposition: A | Discharge status: Home / Self Care | Payer: Medicaid Other | Source: Ambulatory Visit | Attending: Certified Nurse Midwife | Admitting: Certified Nurse Midwife

## 2022-08-08 VITALS — BP 105/60 | HR 104 | Resp 16 | Ht 64.0 in | Wt 115.5 lb

## 2022-08-08 DIAGNOSIS — N760 Acute vaginitis: Secondary | ICD-10-CM | POA: Insufficient documentation

## 2022-08-08 DIAGNOSIS — Z23 Encounter for immunization: Secondary | ICD-10-CM

## 2022-08-08 NOTE — Patient Instructions (Signed)
HPV (Human Papillomavirus) Vaccine: What You Need to Know Many vaccine information statements are available in Spanish and other languages. See www.immunize.org/vis. 1. Why get vaccinated? HPV (human papillomavirus) vaccine can prevent infection with some types of human papillomavirus. HPV infections can cause certain types of cancers, including: cervical, vaginal, and vulvar cancers in women penile cancer in men anal cancers in both men and women cancers of tonsils, base of tongue, and back of throat (oropharyngeal cancer) in both men and women HPV infections can also cause anogenital warts. HPV vaccine can prevent over 90% of cancers caused by HPV. HPV is spread through intimate skin-to-skin or sexual contact. HPV infections are so common that nearly all people will get at least one type of HPV at some time in their lives. Most HPV infections go away on their own within 2 years. But sometimes HPV infections will last longer and can cause cancers later in life. 2. HPV vaccine HPV vaccine is routinely recommended for adolescents at 11 or 21 years of age to ensure they are protected before they are exposed to the virus. HPV vaccine may be given beginning at age 9 years and vaccination is recommended for everyone through 21 years of age. HPV vaccine may be given to adults 27 through 21 years of age, based on discussions between the patient and health care provider. Most children who get the first dose before 15 years of age need 2 doses of HPV vaccine. People who get the first dose at or after 15 years of age and younger people with certain immunocompromising conditions need 3 doses. Your health care provider can give you more information. HPV vaccine may be given at the same time as other vaccines. 3. Talk with your health care provider Tell your vaccination provider if the person getting the vaccine: Has had an allergic reaction after a previous dose of HPV vaccine, or has any severe,  life-threatening allergies Is pregnant--HPV vaccine is not recommended until after pregnancy In some cases, your health care provider may decide to postpone HPV vaccination until a future visit. People with minor illnesses, such as a cold, may be vaccinated. People who are moderately or severely ill should usually wait until they recover before getting HPV vaccine. Your health care provider can give you more information. 4. Risks of a vaccine reaction Soreness, redness, or swelling where the shot is given can happen after HPV vaccination. Fever or headache can happen after HPV vaccination. People sometimes faint after medical procedures, including vaccination. Tell your provider if you feel dizzy or have vision changes or ringing in the ears. As with any medicine, there is a very remote chance of a vaccine causing a severe allergic reaction, other serious injury, or death. 5. What if there is a serious problem? An allergic reaction could occur after the vaccinated person leaves the clinic. If you see signs of a severe allergic reaction (hives, swelling of the face and throat, difficulty breathing, a fast heartbeat, dizziness, or weakness), call 9-1-1 and get the person to the nearest hospital. For other signs that concern you, call your health care provider. Adverse reactions should be reported to the Vaccine Adverse Event Reporting System (VAERS). Your health care provider will usually file this report, or you can do it yourself. Visit the VAERS website at www.vaers.hhs.gov or call 1-800-822-7967. VAERS is only for reporting reactions, and VAERS staff members do not give medical advice. 6. The National Vaccine Injury Compensation Program The National Vaccine Injury Compensation Program (VICP) is a   federal program that was created to compensate people who may have been injured by certain vaccines. Claims regarding alleged injury or death due to vaccination have a time limit for filing, which may be as  short as two years. Visit the VICP website at www.hrsa.gov/vaccinecompensation or call 1-800-338-2382 to learn about the program and about filing a claim. 7. How can I learn more? Ask your health care provider. Call your local or state health department. Visit the website of the Food and Drug Administration (FDA) for vaccine package inserts and additional information at www.fda.gov/vaccines-blood-biologics/vaccines. Contact the Centers for Disease Control and Prevention (CDC): Call 1-800-232-4636 (1-800-CDC-INFO) or Visit CDC's website at www.cdc.gov/vaccines. Source: CDC Vaccine Information Statement HPV Vaccine (09/02/2019) This same material is available at www.cdc.gov for no charge. This information is not intended to replace advice given to you by your health care provider. Make sure you discuss any questions you have with your health care provider. Document Revised: 02/03/2022 Document Reviewed: 02/03/2022 Elsevier Patient Education  2024 Elsevier Inc.  

## 2022-08-08 NOTE — Progress Notes (Signed)
    NURSE VISIT NOTE  Subjective:    Patient ID: Kaitlin Ford, female    DOB: 05-21-2000, 22 y.o.   MRN: [id number]  HPI  Patient is a 22 y.o. G77P0000 female who presents for thick and vaginal irritation vaginal discharge for 5 day(s). Denies abnormal vaginal bleeding or significant pelvic pain or fever. denies dysuria, hematuria, urinary frequency, urinary urgency, flank pain, abdominal pain, and pelvic pain. Patient denies history of known exposure to STD.  She is also here for her first Gardasil 9 injection. She said that mychart alerted her that she was due.  Objective:    BP 105/60   Pulse (!) 104   Resp 16   Ht 5\' 4"  (1.626 m)   Wt 115 lb 8 oz (52.4 kg)   BMI 19.83 kg/m    @THIS  VISIT ONLY@  Assessment:   1. Acute vaginitis     rule out GC or chlamydia and nonspecific vaginitis  Plan:   GC and chlamydia DNA  probe sent to lab. Treatment: abstain from coitus during course of treatment. Explained to her that if her symptoms worsen she could use a 7 day course of monistat and treat BV with boric acid vaginal suppositories.  ROV prn if symptoms persist or worsen. RTC in 2 months for her 2nd dose of Gardasil.     Santiago Bumpers, CMA [facility]

## 2022-08-12 LAB — CERVICOVAGINAL ANCILLARY ONLY
Bacterial Vaginitis (gardnerella): POSITIVE — AB
Candida Glabrata: NEGATIVE
Candida Vaginitis: NEGATIVE
Chlamydia: NEGATIVE
Comment: NEGATIVE
Comment: NEGATIVE
Comment: NEGATIVE
Comment: NEGATIVE
Comment: NEGATIVE
Comment: NORMAL
Neisseria Gonorrhea: NEGATIVE
Trichomonas: NEGATIVE

## 2022-08-13 ENCOUNTER — Encounter: Payer: Self-pay | Admitting: Certified Nurse Midwife

## 2022-08-13 ENCOUNTER — Other Ambulatory Visit: Payer: Self-pay | Admitting: Certified Nurse Midwife

## 2022-08-13 MED ORDER — METRONIDAZOLE 500 MG PO TABS: 500.0000 mg | ORAL_TABLET | Freq: Two times a day (BID) | ORAL | 0 refills | Status: AC

## 2022-08-28 DIAGNOSIS — Z419 Encounter for procedure for purposes other than remedying health state, unspecified: Secondary | ICD-10-CM | POA: Diagnosis not present

## 2022-09-10 ENCOUNTER — Other Ambulatory Visit: Payer: Self-pay

## 2022-09-10 ENCOUNTER — Emergency Department
Admission: EM | Admit: 2022-09-10 | Discharge: 2022-09-10 | Disposition: A | Discharge status: Home / Self Care | Payer: Medicaid Other | Attending: Emergency Medicine | Admitting: Emergency Medicine

## 2022-09-10 DIAGNOSIS — U071 COVID-19: Secondary | ICD-10-CM | POA: Insufficient documentation

## 2022-09-10 DIAGNOSIS — R059 Cough, unspecified: Secondary | ICD-10-CM | POA: Diagnosis present

## 2022-09-10 LAB — RESP PANEL BY RT-PCR (RSV, FLU A&B, COVID)  RVPGX2
Influenza A by PCR: NEGATIVE
Influenza B by PCR: NEGATIVE
Resp Syncytial Virus by PCR: NEGATIVE
SARS Coronavirus 2 by RT PCR: POSITIVE — AB

## 2022-09-10 MED ORDER — ONDANSETRON 4 MG PO TBDP: 4.0000 mg | ORAL_TABLET | Freq: Three times a day (TID) | ORAL | 0 refills | Status: AC | PRN

## 2022-09-10 MED ORDER — PSEUDOEPH-BROMPHEN-DM 30-2-10 MG/5ML PO SYRP
5.0000 mL | ORAL_SOLUTION | Freq: Four times a day (QID) | ORAL | 0 refills | Status: DC | PRN
Start: 2022-09-10 — End: 2023-05-19

## 2022-09-10 MED ORDER — NIRMATRELVIR/RITONAVIR (PAXLOVID)TABLET: 3.0000 | ORAL_TABLET | Freq: Two times a day (BID) | ORAL | 0 refills | Status: AC

## 2022-09-10 NOTE — Discharge Instructions (Addendum)
Take the prescription meds as directed.  Since you have been prescribed Paxlovid, you should use a barrier method of birth control, to prevent an unwanted pregnancy.  Paxlovid can decrease the effectiveness of your oral contraceptive.

## 2022-09-10 NOTE — ED Provider Notes (Signed)
[facility] Emergency Department Provider Note     Event Date/Time   First MD Initiated Contact with Patient 09/10/22 2057     (approximate)   History   Covid Exposure   HPI  Kaitlin Ford is a 22 y.o. female with a noncontributory medical history, presents to the ED for evaluation of cough, body aches, fatigue, and malaise for the last 2 to 3 days.  Patient has been exposed to her boyfriend, who recently tested positive for COVID.  Patient presents to the ED for COVID testing.  She denies any chest pain, shortness breath, or fevers.  Physical Exam   Triage Vital Signs: ED Triage Vitals  Encounter Vitals Group     BP 09/10/22 2011 119/77     Systolic BP Percentile --      Diastolic BP Percentile --      Pulse Rate 09/10/22 2011 94     Resp 09/10/22 2011 18     Temp 09/10/22 2011 98.6 F (37 C)     Temp Source 09/10/22 2011 Oral     SpO2 09/10/22 2011 98 %     Weight 09/10/22 2010 115 lb (52.2 kg)     Height 09/10/22 2010 5\' 4"  (1.626 m)     Head Circumference --      Peak Flow --      Pain Score 09/10/22 2010 4     Pain Loc --      Pain Education --      Exclude from Growth Chart --     Most recent vital signs: Vitals:   09/10/22 2011  BP: 119/77  Pulse: 94  Resp: 18  Temp: 98.6 F (37 C)  SpO2: 98%    General Awake, no distress. NAD HEENT NCAT. PERRL. EOMI. No rhinorrhea. Mucous membranes are moist.  CV:  Good peripheral perfusion.  RESP:  Normal effort.  ABD:  No distention.    ED Results / Procedures / Treatments   Labs (all labs ordered are listed, but only abnormal results are displayed) Labs Reviewed  RESP PANEL BY RT-PCR (RSV, FLU A&B, COVID)  RVPGX2 - Abnormal; Notable for the following components:      Result Value   SARS Coronavirus 2 by RT PCR POSITIVE (*)    All other components within normal limits     EKG   RADIOLOGY  No results found.   PROCEDURES:  Critical Care performed:  No  Procedures   MEDICATIONS ORDERED IN ED: Medications - No data to display   IMPRESSION / MDM / ASSESSMENT AND PLAN / ED COURSE  I reviewed the triage vital signs and the nursing notes.                              Differential diagnosis includes, but is not limited to, COVID, flu, RSV, viral URI, strep pharyngitis, AOM, CAP, bronchitis, viral gastroenteritis  Patient's presentation is most consistent with acute complicated illness / injury requiring diagnostic workup.  Patient's diagnosis is consistent with clinical COVID exposure. Patient will be discharged home with prescriptions for Paxlovid, Bromfed syrup, and Zofran. Patient is to follow up with primary provider as needed or otherwise directed. Patient is given ED precautions to return to the ED for any worsening or new symptoms.  She has also requested that her test results be noted on her work note for her employer.   FINAL CLINICAL IMPRESSION(S) / ED DIAGNOSES  Final diagnoses:  COVID     Rx / DC Orders   ED Discharge Orders     None        Note:  This document was prepared using Dragon voice recognition software and may include unintentional dictation errors.    Lissa Hoard, PA-C 09/10/22 2357    Corena Herter, MD 09/11/22 [phone]

## 2022-09-10 NOTE — ED Triage Notes (Signed)
Pt reports cough, body aches, fatigue x 2-3 days. Boyfriend tested + for COVID. Pt requesting COVID test. Pt alert and oriented following commands. Breathing unlabored speaking in full sentences. Symmetric chest rise and fall. Pt ambulatory

## 2022-09-28 DIAGNOSIS — Z419 Encounter for procedure for purposes other than remedying health state, unspecified: Secondary | ICD-10-CM | POA: Diagnosis not present

## 2022-10-10 ENCOUNTER — Ambulatory Visit (INDEPENDENT_AMBULATORY_CARE_PROVIDER_SITE_OTHER): Payer: Medicaid Other

## 2022-10-10 ENCOUNTER — Other Ambulatory Visit: Payer: Self-pay

## 2022-10-10 VITALS — BP 109/70 | HR 77 | Ht 64.0 in | Wt 111.2 lb

## 2022-10-10 DIAGNOSIS — Z30016 Encounter for initial prescription of transdermal patch hormonal contraceptive device: Secondary | ICD-10-CM

## 2022-10-10 DIAGNOSIS — Z23 Encounter for immunization: Secondary | ICD-10-CM | POA: Diagnosis not present

## 2022-10-10 MED ORDER — NORELGESTROMIN-ETH ESTRADIOL 150-35 MCG/24HR TD PTWK
1.0000 | MEDICATED_PATCH | TRANSDERMAL | 0 refills | Status: DC
Start: 2022-10-10 — End: 2023-01-07

## 2022-10-10 NOTE — Progress Notes (Signed)
NURSE VISIT NOTE  Subjective:    Patient ID: Kaitlin Ford, female    DOB: [birth year], 22 y.o.   MRN: [id number]  HPI  Patient is a 22 y.o. G1P0000 female Significant Other African American female who presents for her second Gardasil injection.   Objective:    BP 109/70   Pulse 77   Ht 5\' 4"  (1.626 m)   Wt 111 lb 3.2 oz (50.4 kg)   BMI 19.09 kg/m     Contraception:  Hormonal Contraception: Injection, Rings and Patches Given by: Georgiana Shore, CMA Site:  left deltoid   Assessment:   1. Need for HPV vaccine      Plan:   Patient will return in 4 months for third injection.    Loman Chroman, CMA

## 2022-10-28 DIAGNOSIS — Z419 Encounter for procedure for purposes other than remedying health state, unspecified: Secondary | ICD-10-CM | POA: Diagnosis not present

## 2022-11-28 DIAGNOSIS — Z419 Encounter for procedure for purposes other than remedying health state, unspecified: Secondary | ICD-10-CM | POA: Diagnosis not present

## 2022-12-03 ENCOUNTER — Telehealth: Payer: Self-pay

## 2022-12-03 NOTE — Telephone Encounter (Signed)
Pt calling; is on xulane bc patch; [facility] has started giving her another brand and it doesn't stick that well.  What's up with that?  [phone]  Left detailed 'That sounds like a pharmacy question so please contact your pharm'.

## 2022-12-04 NOTE — Progress Notes (Deleted)
    NURSE VISIT NOTE  Subjective:    Patient ID: Kaitlin Ford, female    DOB: [birth year], 22 y.o.   MRN: [id number]  HPI  Patient is a 22 y.o. G1P0000 female Significant Other African American female who presents for her {FIRST SECOND THIRD:18671} Gardasil injection. Order to administer given by {AOB Providers:28529} on ***.   Objective:    There were no vitals taken for this visit.  22 y.o. LMP:  ***  Contraception:  {CCO Contraception:21020264} Given by: {AOB Clinical [id number] Site:  {left/right:311354} deltoid  Lab Review  @THIS  VISIT ONLY@  Assessment:   No diagnosis found.   Plan:   Patient will return in {Gardasil Return Visit:28539} for {FIRST SECOND THIRD:18671} injection.    Fonda Kinder, CMA

## 2022-12-05 ENCOUNTER — Ambulatory Visit: Payer: Medicaid Other

## 2022-12-28 DIAGNOSIS — Z419 Encounter for procedure for purposes other than remedying health state, unspecified: Secondary | ICD-10-CM | POA: Diagnosis not present

## 2023-01-07 ENCOUNTER — Other Ambulatory Visit: Payer: Self-pay

## 2023-01-07 DIAGNOSIS — Z30016 Encounter for initial prescription of transdermal patch hormonal contraceptive device: Secondary | ICD-10-CM

## 2023-01-07 MED ORDER — NORELGESTROMIN-ETH ESTRADIOL 150-35 MCG/24HR TD PTWK: 1.0000 | MEDICATED_PATCH | TRANSDERMAL | 3 refills | Status: AC

## 2023-01-07 NOTE — Telephone Encounter (Signed)
Pt called triage asking if [facility] RF could be sent. Called pharmacy to check on Rx and was advised that in September a one month supply of [facility] was sent and that Rx made the Rx sent on 04/09/22 inactive. New Rx had to be sent. Rx sent and pt advised she will be due for annual in 03/2023.

## 2023-01-28 DIAGNOSIS — Z419 Encounter for procedure for purposes other than remedying health state, unspecified: Secondary | ICD-10-CM | POA: Diagnosis not present

## 2023-02-13 ENCOUNTER — Ambulatory Visit: Payer: Medicaid Other

## 2023-02-19 ENCOUNTER — Ambulatory Visit: Payer: Medicaid Other

## 2023-02-19 VITALS — BP 114/77 | HR 94 | Ht 64.0 in | Wt 111.2 lb

## 2023-02-19 DIAGNOSIS — Z23 Encounter for immunization: Secondary | ICD-10-CM

## 2023-02-19 NOTE — Progress Notes (Cosign Needed Addendum)
    NURSE VISIT NOTE  Subjective:    Patient ID: Deniss Freundlich, female    DOB: 2000/12/06, 23 y.o.   MRN: [id number]  HPI  Patient is a 23 y.o. G55P0000 female who presents for her third Gardasil injection. Order to administer given by see note on 08/08/22 pervious CMA note.    Objective:    BP 114/77   Pulse 94   Wt 111 lb 3.2 oz (50.4 kg)   Breastfeeding No   BMI 19.09 kg/m    Given by: Beverely Pace, CMA Site:  right deltoid  Lab Review  No results found for any visits on 02/19/23.    Assessment:   1. Need for HPV vaccine      Plan:   Patient will return in prn. Gardasil series complete.  Loney Laurence, CMA
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
## 2023-03-31 ENCOUNTER — Telehealth: Payer: Self-pay

## 2023-03-31 NOTE — Telephone Encounter (Signed)
 Kaitlin Ford called triage she states she became nausea on the 28th and threw up, then went a day without eating cause she was so sick to her stomach. She threw up today, she took pregnancy test it was negative. She's concerned that she may be pregnant. She wants to know if she can come in for blood work to be on the safe side. I advised her to take another test first thing in the morning and let us know the results.

## 2023-05-19 ENCOUNTER — Ambulatory Visit (HOSPITAL_COMMUNITY)
Admission: EM | Admit: 2023-05-19 | Discharge: 2023-05-19 | Disposition: A | Discharge status: Home / Self Care | Payer: Self-pay | Attending: Nurse Practitioner | Admitting: Nurse Practitioner

## 2023-05-19 DIAGNOSIS — F410 Panic disorder [episodic paroxysmal anxiety] without agoraphobia: Secondary | ICD-10-CM | POA: Insufficient documentation

## 2023-05-19 DIAGNOSIS — F419 Anxiety disorder, unspecified: Secondary | ICD-10-CM

## 2023-05-19 MED ORDER — HYDROXYZINE HCL 25 MG PO TABS: 25.0000 mg | ORAL_TABLET | Freq: Three times a day (TID) | ORAL | 0 refills | Status: AC | PRN
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
## 2023-05-19 NOTE — ED Notes (Signed)
 Pt leaving now, AVS given.

## 2023-05-19 NOTE — Discharge Instructions (Addendum)
 Please schedule an appointment to go to the [facility] behavioral health [place] or come back to this location and present to the second floor for our open access to establish care for medication management and therapy services.

## 2023-05-19 NOTE — ED Provider Notes (Signed)
 Behavioral Health Urgent Care Medical Screening Exam  Patient Name: Kaitlin Ford MRN: [id number] Date of Evaluation: 05/19/23 Chief Complaint: " Anxiety Attacks" Diagnosis:  Final diagnoses:  Anxiety disorder with panic attacks   History of Present illness: Kaitlin Ford is a 23 y.o. female who presents to the [facility] health center today with complaints of worsening anxiety rendering her unable to function properly.  Assessment: During encounter with pt, she reports that the anxiety started a few months ago, and has been progressively getting worse, and has gotten to the point where she is having difficulty functioning on a day-by-day basis.  Patient reports feeling sad, poor motivation levels, isolating herself, and not wanting to be around other people, feeling lonely, insomnia, decreased interest in things that make her happy, decreased energy levels, reports that she lives by her self and has a good support system in her mother.   Pt presents with a depressed mood, attention to personal hygiene and grooming is fair, eye contact is good, speech is clear & coherent. Thought contents are organized and logical, and pt currently denies SI/HI/AVH or paranoia. There is no evidence of delusional thoughts.  Denies suicide attempts in the past, states that she does not have any mental health provider or therapist outpatient, asking for medication management for her anxiety. Patient prescribed Hydroxyzine  for management of her anxiety, rationales, benefits and possible side effects of medication explained and pt verbalized understanding. Provided with resources for mental health, educated on the need to make an appt as med will not be refilled here at the UC, verbalized understanding. Asking for a note for her to be able to keep her cats for emotional support as her landlord has asked for such, educated to make an appt to see a therapist to determine need for an emotional support  animal.  Education provided on the fact that if experiencing worsening of psychiatry symptoms including suicidal ideations, homicidal ideations, or having auditory/visual hallucinations, etc, to call 911, [phone], come back to this location, or go to the nearest ER. Pt verbalized understanding of all information provided to her, left facility in no acute distress.  Flowsheet Row ED from 05/19/2023 in [facility] ED from 09/10/2022 in [facility] Emergency Department at [facility] (Discharged) from 04/25/2021 in [facility]  C-SSRS RISK CATEGORY No Risk No Risk No Risk       Psychiatric Specialty Exam  Presentation  General Appearance:Well Groomed  Eye Contact:Fair  Speech:Clear and Coherent  Speech Volume:Normal  Handedness:Right   Mood and Affect  Mood:Anxious  Affect:Congruent   Thought Process  Thought Processes:Coherent  Descriptions of Associations:Intact  Orientation:Full (Time, Place and Person)  Thought Content:Logical; WDL    Hallucinations:None  Ideas of Reference:None  Suicidal Thoughts:No  Homicidal Thoughts:No   Sensorium  Memory:Immediate Fair  Judgment:Good  Insight:Good   Executive Functions  Concentration:Good  Attention Span:Good  Recall:Good  Fund of Knowledge:Good  Language:Good   Psychomotor Activity  Psychomotor Activity:Normal   Assets  Assets:Communication Skills   Sleep  Sleep:Poor  Number of hours: No data recorded  Physical Exam: Physical Exam Constitutional:      Appearance: Normal appearance.  Neurological:     Mental Status: She is alert.  Psychiatric:        Mood and Affect: Mood normal.        Behavior: Behavior normal.        Thought Content: Thought content normal.  Judgment: Judgment normal.    Review of Systems  Psychiatric/Behavioral:  Positive for depression (Denies SI/HI, denies plan or intent to harm self or  others). Negative for hallucinations, memory loss, substance abuse and suicidal ideas. The patient is nervous/anxious and has insomnia.   All other systems reviewed and are negative.  Blood pressure 102/69, pulse 63, temperature 97.9 F (36.6 C), temperature source Oral, resp. rate 16, SpO2 100%. There is no height or weight on file to calculate BMI.  Musculoskeletal: Strength & Muscle Tone: within normal limits Gait & Station: normal Patient leans: N/A   [facility] MSE Discharge Disposition for Follow up and Recommendations: Based on my evaluation the patient does not appear to have an emergency medical condition and can be discharged with resources and follow up care in outpatient services for Medication Management and Individual Therapy-Resources provided for outpatient med management and therapy and pt verbalizes understanding about the need to schedule above using the resources provided.  Robet Chiquito, NP 05/19/2023, 3:59 PM

## 2023-06-06 ENCOUNTER — Other Ambulatory Visit: Payer: Self-pay | Admitting: Certified Nurse Midwife

## 2023-06-06 DIAGNOSIS — Z30016 Encounter for initial prescription of transdermal patch hormonal contraceptive device: Secondary | ICD-10-CM

## 2023-08-12 ENCOUNTER — Telehealth: Payer: Self-pay

## 2023-08-12 NOTE — Telephone Encounter (Signed)
 Pt calling triage with concerns of vaginal internal pain (feeling some in rectal area). Has an appt with us  Monday 7/21 but is there anything she can do in meantime? Advised without exam we cannot advise, she needs to be seen at an urgent care/ER to be evaluated and treated properly.

## 2023-08-13 NOTE — Progress Notes (Deleted)
    GYNECOLOGY PROGRESS NOTE  Subjective:    Patient ID: Kaitlin Ford, female    DOB: [birth year], 23 y.o.   MRN: [id number]  HPI  Patient is a 23 y.o. G91P0000 female who presents for evaluation of pelvic and abdominal pain and pressure.   {Common ambulatory SmartLinks:19316}  Review of Systems {ros; complete:30496}   Objective:   There were no vitals taken for this visit. There is no height or weight on file to calculate BMI. General appearance: {general exam:16600} Abdomen: {abdominal exam:16834} Pelvic: {pelvic exam:16852::cervix normal in appearance,external genitalia normal,no adnexal masses or tenderness,no cervical motion tenderness,rectovaginal septum normal,uterus normal size, shape, and consistency,vagina normal without discharge} Extremities: {extremity exam:5109} Neurologic: {neuro exam:17854}   Assessment:   No diagnosis found.   Plan:   There are no diagnoses linked to this encounter.    Damien Parsley, CNM [facility]

## 2023-08-17 ENCOUNTER — Ambulatory Visit: Payer: Self-pay | Admitting: Certified Nurse Midwife

## 2023-08-17 DIAGNOSIS — Z30016 Encounter for initial prescription of transdermal patch hormonal contraceptive device: Secondary | ICD-10-CM

## 2023-10-05 NOTE — Progress Notes (Deleted)
    NURSE VISIT NOTE  Subjective:    Patient ID: Kaitlin Ford, female    DOB: 07/17/2000, 23 y.o.   MRN: [id number]  HPI  Patient is a 23 y.o. G14P0000 female who presents for evaluation of amenorrhea. She believes she could be pregnant. {pregnancy desire:14500::Pregnancy is desired.} Sexual Activity: {sexual partners:705}. Current symptoms also include: {preg sx:18128}. Last period was {norm/abn:16337}.    Objective:    There were no vitals taken for this visit.  Lab Review  No results found for any visits on 10/06/23.  Assessment:   1. Amenorrhea     Plan:   {AOB + PREGNANCY PLAN:28596:p}  {AOB - PREGNANCY PLAN:28597:p}   Camelia Fetters, CMA [facility]

## 2023-10-06 ENCOUNTER — Ambulatory Visit: Payer: Self-pay

## 2023-10-06 DIAGNOSIS — N912 Amenorrhea, unspecified: Secondary | ICD-10-CM
